# Patient Record
Sex: Male | Born: 1995 | Race: Black or African American | Hispanic: No | Marital: Single | Smoking: Current every day smoker
Health system: Southern US, Community
[De-identification: ages and names within clinical notes are randomized; demographics above are authoritative.]

## PROBLEM LIST (undated history)

## (undated) ENCOUNTER — Encounter

## (undated) ENCOUNTER — Telehealth

## (undated) DIAGNOSIS — I469 Cardiac arrest, cause unspecified: Secondary | ICD-10-CM

---
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
## 2016-04-16 ENCOUNTER — Ambulatory Visit: Attending: Family

## 2016-04-16 DIAGNOSIS — Z025 Encounter for examination for participation in sport: Secondary | ICD-10-CM

## 2016-04-16 DIAGNOSIS — Z6823 Body mass index (BMI) 23.0-23.9, adult: Principal | ICD-10-CM

## 2016-04-16 DIAGNOSIS — Z7189 Other specified counseling: Secondary | ICD-10-CM
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
## 2016-04-16 NOTE — Progress Notes
vitals from 04/16/2016.  Body mass index is 23.19 kg/(m^2). 58 %ile (Z= 0.21) based on CDC 2-20 Years BMI-for-age data using vitals from 04/16/2016.     Growth parameters are appropriate for age.         PHYSICAL EXAM:   NL   AB   Describe if Abnormal    General x      Head  x      Eyes x          Vision x  Passed both eyes at 20/40:yes    Ears x          Hearing   Passed all frequencies at 20 db: yes    Nose x      Pharynx x          Teeth x      Neck x      Nodes x      Lungs x      Cardiac x      Abdomen x      Genitals x  Not Circumcized    Extremities x          Pulses x      Spine x      Skin x      Neuro x     SPORTS PHYSICAL: normal  questionnaire form was completed on 04/16/16 by parent/guardian.  Copy is on file and sent to scanning.       Visual Acuity Screening    Right eye Left eye Both eyes   Without correction: 20/20 20/20 /20/20   With correction:          ASSESSMENT AND PLAN      Rodney Fuller presents today for their 20 y.o. Well Child Check.  General Assessment: WNL     ANTICIPATORY GUIDANCE REVIEWED: Yes No Comments   Physical Growth and Development: Balanced diet, 3 meals a day, limit fast food, physical activity, limit TV, MV or folate x     Social and Academic Competence: Age-appropriate limits, Friends/Relationships, Family time, Community Involvement, Encourage reading and/or school, Rules/ Expectations, Planning for after HS/College x     Emotional Well-being: Dealing with stress, body image, decision-making, mood changes, sexuality, cutting x     Risk Reduction: Safe media, tobacco, passive smoke, alcohol, steroids, drugs, prescription drugs, safe sex, STI's x     Illness prevention:  Dental care, dental home, floss, sun protection, protect hearing (avoid ear buds), sleep practices, snoring x   Sees Dentist     SAFETY/INJURY PREVENTION Yes No  Comments   Seat belts/Driving x     Conflict resolution x     Passive smoke x     Domestic violence screen/counseling x     Gun safety x

## 2016-04-16 NOTE — Progress Notes
Safe relationships x     Sexual safety x     Sports/recreation safety (helmets/gear) x        TESTS/SCREENING: Screened/  Tested Comments   Hgb/Hct Not tested    TB risk factors Not Tested    Hypercholesterolemia risk factors No risks    STI risk factors Not Tested Educated extensively on STI   Dental referral (required age 743 and up) Not needed    Immunizations Flu Vaccine at [employer]      No results found for this or any previous visit.      ICD-10-CM ICD-9-CM    1. BMI 23.0-23.9, adult Z68.23 V85.1    2. Advance directive discussed with patient Z71.89 V65.49    3. Routine sports examination for healthy child or adolescent Z02.5 V70.3        Follow-up visit in 1 year(s) for a Wellness Visit, or sooner as needed.

## 2016-04-16 NOTE — Patient Instructions
balance. Please be sure to let me know if you fall or are fearful you may fall.    Urinary Leakage:   Urine leakage is common, but it is not a normal part of aging. Talk with me about any urine leakage so that the cause can be found and treated. Treatment can include bladder training, exercises, medicine or surgery.    Pain:   We all have aches and pains at times, but chronic pain can change how you feel and live every day. Please talk with me about any symptoms of chronic pain so that we can determine how best to treat.     Sleep:    Getting a good night?s sleep is vital to your health and well-being and can help prevent or manage health problems.   Often, sleep can be improved by changing behaviors, including when you go to bed and what you do before bed.  Sleep apnea can cause problems such as struggling to stay awake during the day. Please let me know if you would like to learn more about improving your sleep and/or think you may have sleep apnea.     Home Safety:   You may benefit from a safety check to identify hazards in your home.     Seat Belt:   Please remember to wear a seat belt when driving or riding in a vehicle. It is one of the most important things you can do to stay safe in a car.    Nutrition:   Remember to eat plenty of fruits, vegetables, whole grains, and dairy. Drink at least 64 ounces (8 full glasses) of water a day, unless you have been advised to limit fluids.      Alcohol:   Alcohol can have a greater effect on older people, who may feel its effects at a lower amount.  Older people should limit alcoholic drinks (no more than one a day for women and no more than two a day for men). Please let me know if alcohol use becomes a problem.    Tobacco:   Not smoking or using other forms of tobacco is one of the most important things you can do for your health.     Advance Directives:   There may come a time when medical decisions need to be made on your

## 2016-04-16 NOTE — Patient Instructions
How Often:  Patient should have ONE of the following:  ? Stool test Yearly:    ? Sigmoidoscopy  Every 5 years  ? Colonoscopy  Every 10 years  To detect colon cancer before it has symptoms, so it can be more easily treated. This does not apply to you.     Depression Screening    Age:  18 and older    How Often: Yearly; As necessary for those with risk factors To identify patients at risk for depression Up to date     Diabetes screening tests    Age:  18 and older    How Often:  Atleast every 3years.      Medicare covers yearly or 6 month intervals for pre-diabetic patients.  Patient must be diagnosed with ONE of the following:  ? High blood pressure  ? History of abnormal cholesterol/triglyceride  ? Obesity (BMI >30 kg/m2)  ? History of high blood sugar  OR any TWO of the following:  ? Over weight (BMI >21 but <30)  ? Family history of Diabetes (parents, siblings)  ? Age 65+years  ? History of gestational diabetes or birth of baby weighing more than 9 pounds To identify diabetes that may not have symptoms. This does not apply to you.       Prostate cancer screening  (PSA or DRE)    Age:  50 (beginning the day after 50th birthday) and based on risk factors:  African American men or family history of prostate cancer    How Often:  Covered once every 12 months To detect prostate cancer.  You have not had a PSA level done in the past 3 years.  Discuss with your provider if this screening applies to you.       Consider test for Abdominal Aortic Aneurysm  (AAA)    Age:  65-75 if you have ever smoked or if your parent, brother, sister or child has had an aneurysm.    How Often:  Once Check for the bulging or ballooning of a large artery, which can lead to complications. This does not apply to you.     Consider Hepatitis C Virus Screening     Age:   If born between 1945-1965 OR         18 years and older based on risk factors    How Often:  Once OR based on risk factors   To detect hepatitis C

## 2016-04-16 NOTE — Patient Instructions
infection that may have no symptoms, to prevent complications. This does not apply to you.     Consider sexually transmitted disease testing    Age:  Consider if sexually active    How Often:   Gonorrhea/Chlamydia-Based on risk factors                           HIV-at least once, continue if risk factors  To detect infections that may not have symptoms, to prevent complications Discuss with your provider if at risk   Consider Alcohol misuse screening    Age:  Based on risk factors    How Often:  Based on risk factors To aid patient with alcohol related issues. Discuss with your provider if at risk   Consider Smoking Cessation Counseling    Age:  Patient must be a smoker    How Often:  Based on risk factors To aid patient with quitting smoking This does not apply to you.       Immunizations you have received:      There is no immunization history on file for this patient.    Recommended Immunizations Why   Influenza Vaccine  18+ years (Flu) Yearly  Shingles (Zoster) 60+years  Once  Tetanus  18+years  Every 10 years  Pneumococcal     Pneumonia: 1-2 doses age 65+    Prevnar: 1 dose age 65+    Consider Hepatitis B  Based on risk factors Influenza Vaccine  Protects against flu virus and its complications  Shingles (Zoster) Protects against shingles  Tetanus Protects against tetanus infection  Pneumococcal Protects against common types of pneumonia       Hepatitis B Protects against hepatitis, which can cause illness and complications

## 2016-04-16 NOTE — Patient Instructions
behalf. Please talk with your family, and with me, about your wishes. It is important to provide information about your decisions, and any formal advance directives, for your medical record.     Additional Support:  Sometimes it can be challenging to manage all aspects of daily life. Finding the right support can help you maintain or improve your health and independence. Please let me know if you would like to talk further about finding resources to assist you.    PREVENTATIVE SCREENING & IMMUNIZATION RECOMMENDATIONS  Below are your current Health maintenance plans and completion dates, if applicable.  Health Maintenance Reminders     Patient has no pending health maintenance at this time          The patient is not eligible for Health Maintenance     The following were addressed and declined:  The patient is not eligible for Health Maintenance     Preventative Screening Recommendations:  Below are general recommendations.  You may need them more frequently, less frequently, or not at all as we discussed during your visit, based on your individual needs, risk factors and preferences.    Preventive Service Why Status   Blood Pressure      Age: 7018 and older     How Often: Every visit, atleast every year To help prevent problems related to high blood pressure (heart attack and kidney problems) or low blood pressure (falls and dizziness). Most Recent BP Reading   04/16/16 128/68      Cardiovascular screening blood tests     (Cholesterol, Lipids, Triglycerides)    Age: Initial test for all at age 19; repeat at 2145 and continue until 4575   How Often:  Every 5 years (if normal) To check for high cholesterol, which can increase your risk of heart attack, stroke and other problems. Cholesterol:  Overdue    HDL:  Overdue    LDL:  Overdue    Triglycerides:  Overdue     Colon Cancer Screening    Age: 3250-75; if African-American begin screening at 5745, with colonoscopy recommended

## 2016-04-16 NOTE — Patient Instructions
PERSONAL PREVENTION PLAN   Thank you for taking time to come in and have your Annual Wellness Visit ? it is important that we have the opportunity to spend additional time with you once a year to discuss not only your preventative care needs, but to review how your care is being managed overall. We hope that you found today?s visit meaningful.     Your Personal Prevention Plan is based on your overall health and your responses to the health questionnaires completed. The following information is for you to review in addition to the recommendations, referrals, and tests we have discussed at your visit.        Things that may be affecting your health:  Tobacco Use      Physical Activity:   Physical activity can help you maintain a healthy weight, prevent or control illness, reduce stress, and sleep better. It can also help you improve your balance to avoid falls. Try to build up to and maintain a total of 30 minutes of activity each day. If you are able, try walking, doing yard or housework, and taking the stairs more often. You can also strengthen your muscles with exercises done while sitting or lying down.     Emotional Health:   Feeling ?down in the dumps? or anxious every now and then is a natural part of life. If this feeling lasts for a few weeks or more, talk with me as soon as possible. It could be a sign of a problem that needs treatment. There are many types of treatment available.      Falls:   You can reduce your risk of falling by making changes in your home. Remove items that may cause tripping, improve lighting, and consider installing grab bars.  Talk with me if you have problems with balance and walking. To prevent falls, you may need your vision, hearing, or blood pressure checked. Exercises to improve your strength and balance, or using a cane or walker, may help.  Review your medicines with me at every visit, because some can affect

## 2016-10-25 ENCOUNTER — Inpatient Hospital Stay: Admit: 2016-10-25 | Discharge: 2016-10-25

## 2016-10-25 ENCOUNTER — Emergency Department: Admit: 2016-10-25 | Discharge: 2016-10-25

## 2016-10-25 DIAGNOSIS — Z79899 Other long term (current) drug therapy: Secondary | ICD-10-CM

## 2016-10-25 DIAGNOSIS — S81011A Laceration without foreign body, right knee, initial encounter: Secondary | ICD-10-CM

## 2016-10-25 DIAGNOSIS — Z880 Allergy status to penicillin: Secondary | ICD-10-CM

## 2016-10-25 DIAGNOSIS — S022XXA Fracture of nasal bones, initial encounter for closed fracture: Secondary | ICD-10-CM

## 2016-10-25 DIAGNOSIS — S0181XA Laceration without foreign body of other part of head, initial encounter: Secondary | ICD-10-CM

## 2016-10-25 DIAGNOSIS — Z833 Family history of diabetes mellitus: Secondary | ICD-10-CM

## 2016-10-25 DIAGNOSIS — S62326A Displaced fracture of shaft of fifth metacarpal bone, right hand, initial encounter for closed fracture: Secondary | ICD-10-CM

## 2016-10-25 DIAGNOSIS — M79641 Pain in right hand: Principal | ICD-10-CM

## 2016-10-25 DIAGNOSIS — M84441A Pathological fracture, right hand, initial encounter for fracture: Principal | ICD-10-CM

## 2016-10-25 MED ORDER — HYDROCODONE-ACETAMINOPHEN 5-325 MG PO TABS
1 | ORAL_TABLET | Freq: Once | ORAL | Status: CP
Start: 2016-10-25 — End: ?

## 2016-10-25 MED ORDER — IBUPROFEN 800 MG PO TABS
800 mg | Freq: Four times a day (QID) | ORAL | 0 refills | Status: CP | PRN
Start: 2016-10-25 — End: ?

## 2016-10-25 MED ORDER — IODIXANOL 320 MG/ML IV SOLN
100 mL | Freq: Once | INTRAVENOUS | Status: CP
Start: 2016-10-25 — End: ?

## 2016-10-25 MED ORDER — HYDROCODONE-ACETAMINOPHEN 5-325 MG PO TABS
1 | ORAL_TABLET | Freq: Four times a day (QID) | ORAL | 0 refills | Status: CP | PRN
Start: 2016-10-25 — End: ?

## 2016-10-25 MED ORDER — TETANUS-DIPHTH-ACELL PERTUSSIS 5-2-15.5 LF-MCG/0.5 IM SUSP
.5 mL | Freq: Once | INTRAMUSCULAR | Status: CP
Start: 2016-10-25 — End: ?

## 2016-10-25 MED ORDER — LIDOCAINE HCL 2 % IJ SOLN
Freq: Once | Status: CP
Start: 2016-10-25 — End: ?

## 2016-10-25 NOTE — ED Notes
Ortho resident at bedside to assess R hand.

## 2016-10-25 NOTE — Consults
BP Temp Temp src Pulse Resp SpO2 Height Weight   10/25/16 0800 129/77 - - 76 19 100 % - -   10/25/16 0730 127/71 - - 71 23 99 % - -   10/25/16 0700 132/71 - - 78 13 100 % - -   10/25/16 0650 146/79 - - 79 21 99 % - -   10/25/16 0640 134/71 - - 72 25 100 % - -   10/25/16 0554 138/76 36.7 ?C (98 ?F) Oral 81 26 100 % 1.829 m (6') 82 kg (180 lb 12.4 oz)   10/25/16 0540 139/74 - - 77 22 100 % - -                Physical Exam:    Constitutional: well developed, well nourished, in no apparent distress  Cardiovascular: normal rate and regular rhythm    Pulmonary/Chest: clear bilaterally  Abdominal:  abdomen is soft without significant tenderness  Musculoskeletal: full range of motion without tenderness or swelling in joints  Neurological:  mental status, speech normal, alert and oriented x 3, PERLA  Neck:   supple, no adenopathy  HEENT:       General: 2cm lac deep to muscle at left forehead  Eyes: PERRL and EOMI  Nose: Normal  Ear: Hearing normal   Mouth: MIO 40mm  Dentition: grossly intact  Intraoral soft tissue: FOM soft, non-tender to palpation, non raised bilaterally, no ulceration, no laceration, no edema, no erythema  Intraoral hard tissue: no step deformity, no mobility of maxillary or mandibular segments  Occlusion: stable and repeatable  Trigeminal nerve findings: No abnormal findings  Facial Nerve: No abnormal findings    Data Review:  Recent Results (from the past 24 hour(s))   Type and Screen    Collection Time: 10/25/16  5:20 AM   Result Value Ref Range    ABO Grouping O     Rh Type Positive     Antibody Screen Negative     Specimen Expiration 2016-10-28 23:59    BMP    Collection Time: 10/25/16  5:49 AM   Result Value Ref Range    Sodium 143 135 - 145 mmol/L    Potassium 3.6 3.3 - 4.6 mmol/L    Chloride 105 101 - 110 mmol/L    CO2 25 21 - 29 mmol/L    Urea Nitrogen 12 6 - 22 mg/dL    Creatinine 1.610.96 0.960.67 - 1.17 mg/dL    BUN/Creatinine Ratio 12.5 6.0 - 22.0 (calc)    Glucose 91 71 - 99 mg/dL

## 2016-10-25 NOTE — ED Notes
Pt log rolled off backboard maintaining C-spine immobilization.  No tenderness noted to C-, T-, or L-spine area.  Contusion noted to left flank area.  No step offs noted.  Pt log rolled back to supine position.  Warm blankets applied.

## 2016-10-25 NOTE — ED Notes
Aram CandelaJohn R., PA at bedside to repair R knee laceration.
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
## 2016-10-25 NOTE — Consults
Calcium 9.2 8.6 - 10.0 mg/dL    Osmolality Calc 284.3     Anion Gap 13 4 - 16 mmol/L    EGFR >59 mL/min/1.73M2   PT    Collection Time: 10/25/16  5:49 AM   Result Value Ref Range    Protime 13.5 11.9 - 14.3 seconds    INR 1.0 0.9 - 1.1   PTT    Collection Time: 10/25/16  5:49 AM   Result Value Ref Range    PTT 33 24 - 35 seconds   Osmolality Blood    Collection Time: 10/25/16  5:49 AM   Result Value Ref Range    Osmolality 290 274 - 298 mosm/Kg   Ethyl Alcohol    Collection Time: 10/25/16  5:49 AM   Result Value Ref Range    Ethyl Alcohol <10.0 <=10.0 mg/dL    Alcohol g/dL <0.01 g//dL   CBC with Differential panel result    Collection Time: 10/25/16  5:49 AM   Result Value Ref Range    WBC 8.18 4.5 - 11 x10E3/uL    RBC 4.25 (L) 4.50 - 6.30 x10E6/uL    Hemoglobin 13.4 (L) 14.0 - 18.0 g/dL    Hematocrit 39.8 (L) 40.0 - 54.0 %    MCV 93.6 82.0 - 101.0 fl    MCH 31.5 27.0 - 34.0 pg    MCHC 33.7 31.0 - 36.0 g/dL    RDW 12.1 12.0 - 16.1 %    Platelet Count 261 140 - 440 thou/cu mm    MPV 10.1 9.5 - 11.5 fl    nRBC % 0.0 0.0 - 1.0 %    Absolute NRBC Count 0.00     Neutrophils % 69.3 34.0 - 73.0 %    Lymphocytes % 20.8 (L) 25.0 - 45.0 %    Monocytes % 7.6 (H) 2.0 - 6.0 %    Eosinophils % 2.0 1.0 - 4.0 %    Immature Granulocytes % 0.2 0.0 - 2.0 %    Neutrophils Absolute 5.67 1.80 - 8.70 x10E3/uL    Lymphocytes Absolute 1.70 x10E3/uL    Monocytes Absolute 0.62 x10E3/uL    Eosinophils Absolute 0.16 x10E3/uL    Basophil Absolute 0.01 x10E3/uL    Absolute Immature Granulocytes 0.02 (H) 0 - 0 x10E3/uL    Basophils % 0.1 0 - 1 %     Per Radiology:  Ct Maxface Dental W/o Iv Con    Result Date: 10/25/2016  Impression: No acute intracranial abnormality. Right frontotemporal scalp hematoma multiple radiodense foreign bodies. CT MAXFACE DENTAL W/O IV CON HISTORY: 21 years Male. INDICATION: Head trauma. TECHNIQUE: Maxillofacial CT was performed without intravenous contrast. Images were obtained of

## 2016-10-25 NOTE — ED Notes
Pt ambulated, voided, and tolerated po w/o difficulty.
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
## 2016-10-25 NOTE — Progress Notes
Pt wanted to contact grandmother but was unable to remember her contact number. Advised pt chaplain will check back later.   Pt provided contact information for grandmother, Rodney Fuller [phone]) No answer. No VM

## 2016-10-25 NOTE — Procedures
Oral Maxillofacial Surgery Resident  Procedure Note      Attending: Dr. Michele McalpineAnthony Bunnell    Lacerations: 2cm  laceration deep to muscle at left forehead    Anesthetic: 1.5% Lidocaine with 1:200,000 epinephrine, via infiltration and blocks.    Procedure: Irrigated site with copious amounts of normal saline. Closed lacerations in layers with vicryl and prolene sutures. Pt tolerated the procedure well. Pt was hemostatic at procedure end.     OMFS Recommendations:   - Keep wound clean with normal saline soaked gauze. Remove any scabs  - Dab dry  - Place thin layer of triple antibiotic on wound 2 times daily  - If at home, leave open to air  - If out, cover with simple dressing  - Keep out of sun for 6 months, or wear SPF  - Shower but avoid that area for 48 hours  - Do not soak in bath/pool/ocean for 2 weeks  - F/u in OMFS clinic in 7-10 days  - Please have patient call [phone] to schedule an appointment.    Thank you for allowing us to participate in the care of this patient.   Please do not hesitate to page the OMFS on call resident at [phone], if there are any questions or concerns    Rodney ChurchMatthew Fuller, DDS  10/25/2016 9:10 AM

## 2016-10-25 NOTE — Consults
secretions. Study: Cervical spine CT without contrast Indication: Head trauma. Comparison: None. Findings: Straightening of the cervical spine without fracture or listhesis. Craniocervical and atlantoaxial joints are maintained. No significant degenerative change lung apices are intact. Right paratracheal diverticulum noted. Impression: Straightening of the spine without fracture or listhesis. Incidental paratracheal diverticulum noted.     Per Radiology:  Xr Chest Single View    Result Date: 10/25/2016  No acute pulmonary process.     Per Radiology:  Xr Hand Right 3 Views    Result Date: 10/25/2016  Findings/impression: There is angulated fracture involving fifth metacarpal bone. There is adjacent soft tissue swelling. No dislocations are seen. No radiopaque foreign bodies are seen.    Per Radiology:  Xr Knee Right 4  Views    Result Date: 10/25/2016  Findings/impression: No evidence of fracture or dislocation. Joint spaces are maintained. Soft tissues are normal.    Per Radiology:  Xr Pelvis 1 Or 2 Views    Result Date: 10/25/2016  Findings/impression: No acute fracture or dislocations are identified. No soft tissue abnormalities or radiopaque foreign bodies are seen.     Per Radiology:  Xr Tibia & Fibula Right    Result Date: 10/25/2016  Findings/impression: No evidence of fracture or dislocation. Joint spaces are maintained. Soft tissues are normal.    Per Radiology:  Cta Trauma Thoracic Aorta W/o & W/ Iv Co    Result Date: 10/25/2016  Impression: Limited examination as discussed above. Normal examination with no traumatic abnormality of the thoracic aorta. Read By Burr Medico- Gregory Wynn M.D.  Electronically Verified By - Burr MedicoGregory Wynn M.D.  Released Date Time - 10/25/2016 8:28 AM  Resident -        Assessment & Recommendations:      21 y.o. male was evaluated for 2cm lac deep to muscle at left forehead. Lacs were repaired bedside under local anesthesia. Although scna

## 2016-10-25 NOTE — ED Notes
IV removed with catheter intact.  Pressure applied, Time of discharge: 1315  PM., Patient discharged to  Home.  Patient discharged  ambulatory. to exit with belongings in  Stable condition.  Patient escorted by  friend., Written discharge instructions given to  patient.  Patient/recipient  verbalizes discharge instructions.

## 2016-10-25 NOTE — Progress Notes
Pt wanted to contact grandmother but was unable to remember her contact number. Advised pt chaplain will check back later.

## 2016-10-25 NOTE — ED Notes
OMFS resident at bedside to repair laceration.

## 2016-10-25 NOTE — ED Notes
Pt to CT with Jack, EDT.

## 2016-10-25 NOTE — ED Notes
Report given to Holly, RN.  Pt stable at report.

## 2016-10-25 NOTE — ED Notes
Pt medicated for pain.
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
## 2016-10-25 NOTE — Consults
Soft tissue swelling is over the nasal bridge. Nondisplaced  right nasal bone fracture. Nasal septum is midline. Globes are intact. Retrobulbar fat is maintained. Maxillary mucosal thickening. Mandible is intact. TMJs are located. Nasopharyngeal secretions noted. Mastoid air cells are clear. IMPRESSION: Left supraorbital scalp laceration. Nondisplaced right nasal bone fracture with precanthal soft tissue swelling. Nasopharyngeal secretions. Study: Cervical spine CT without contrast Indication: Head trauma. Comparison: None. Findings: Straightening of the cervical spine without fracture or listhesis. Craniocervical and atlantoaxial joints are maintained. No significant degenerative change lung apices are intact. Right paratracheal diverticulum noted. Impression: Straightening of the spine without fracture or listhesis. Incidental paratracheal diverticulum noted.     Per Radiology:  Ct Head W/o Con    Result Date: 10/25/2016  Impression: No acute intracranial abnormality. Right frontotemporal scalp hematoma multiple radiodense foreign bodies. CT MAXFACE DENTAL W/O IV CON HISTORY: [age] years Male. INDICATION: Head trauma. TECHNIQUE: Maxillofacial CT was performed without intravenous contrast. Images were obtained of  the maxillofacial skeleton and paranasal sinuses and extended to include the orbits, skull base, and portions of the temporal bones. Multiplanar reconstructions were generated and reviewed. COMPARISON: No studies available for comparison. FINDINGS: Left supraorbital scalp laceration. Soft tissue swelling is over the nasal bridge. Nondisplaced  right nasal bone fracture. Nasal septum is midline. Globes are intact. Retrobulbar fat is maintained. Maxillary mucosal thickening. Mandible is intact. TMJs are located. Nasopharyngeal secretions noted. Mastoid air cells are clear. IMPRESSION: Left supraorbital scalp laceration. Nondisplaced right nasal bone fracture with precanthal soft tissue swelling. Nasopharyngeal

## 2016-10-25 NOTE — Consults
the maxillofacial skeleton and paranasal sinuses and extended to include the orbits, skull base, and portions of the temporal bones. Multiplanar reconstructions were generated and reviewed. COMPARISON: No studies available for comparison. FINDINGS: Left supraorbital scalp laceration. Soft tissue swelling is over the nasal bridge. Nondisplaced  right nasal bone fracture. Nasal septum is midline. Globes are intact. Retrobulbar fat is maintained. Maxillary mucosal thickening. Mandible is intact. TMJs are located. Nasopharyngeal secretions noted. Mastoid air cells are clear. IMPRESSION: Left supraorbital scalp laceration. Nondisplaced right nasal bone fracture with precanthal soft tissue swelling. Nasopharyngeal secretions. Study: Cervical spine CT without contrast Indication: Head trauma. Comparison: None. Findings: Straightening of the cervical spine without fracture or listhesis. Craniocervical and atlantoaxial joints are maintained. No significant degenerative change lung apices are intact. Right paratracheal diverticulum noted. Impression: Straightening of the spine without fracture or listhesis. Incidental paratracheal diverticulum noted.     Per Radiology:  Ct Abdomen Pelvis W Con    Result Date: 10/25/2016  Impression: No acute intra-abdominal pathology.     Per Radiology:  Ct C Spine W/o Con    Result Date: 10/25/2016  Impression: No acute intracranial abnormality. Right frontotemporal scalp hematoma multiple radiodense foreign bodies. CT MAXFACE DENTAL W/O IV CON HISTORY: [age] years Male. INDICATION: Head trauma. TECHNIQUE: Maxillofacial CT was performed without intravenous contrast. Images were obtained of  the maxillofacial skeleton and paranasal sinuses and extended to include the orbits, skull base, and portions of the temporal bones. Multiplanar reconstructions were generated and reviewed. COMPARISON: No studies available for comparison. FINDINGS: Left supraorbital scalp laceration.

## 2016-10-25 NOTE — ED Notes
FAST negative per Hester, MD.

## 2016-10-25 NOTE — ED Notes
Aram CandelaJohn R., PA at bedside to repair R knee laceration. Cervical collar cleared.

## 2016-10-25 NOTE — ED Notes
Pt log rolled off backboard maintaining C-spine immobilization.  No tenderness noted to C-, T-, or L-spine area.  No step offs noted.  Pt log rolled back to supine position.  Warm blankets applied.

## 2016-10-25 NOTE — Consults
BP Temp Temp src Pulse Resp SpO2 Height Weight   10/25/16 0800 129/77 - - 76 19 100 % - -   10/25/16 0730 127/71 - - 71 23 99 % - -   10/25/16 0700 132/71 - - 78 13 100 % - -   10/25/16 0650 146/79 - - 79 21 99 % - -   10/25/16 0640 134/71 - - 72 25 100 % - -   10/25/16 0554 138/76 36.7 ?C (98 ?F) Oral 81 26 100 % 1.829 m (6') 82 kg (180 lb 12.4 oz)   10/25/16 0540 139/74 - - 77 22 100 % - -                Physical Exam:    Constitutional: well developed, well nourished, in no apparent distress  Cardiovascular: normal rate and regular rhythm    Pulmonary/Chest: clear bilaterally  Abdominal:  abdomen is soft without significant tenderness  Musculoskeletal: full range of motion without tenderness or swelling in joints  Neurological:  mental status, speech normal, alert and oriented x 3, PERLA  Neck:   supple, no adenopathy  HEENT:       General: 2cm lac deep to muscle at left forehead, scabbing at R forehead  Eyes: PERRL and EOMI  Nose: Normal  Ear: Hearing normal   Mouth: MIO 40mm  Dentition: grossly intact  Intraoral soft tissue: FOM soft, non-tender to palpation, non raised bilaterally, no ulceration, no laceration, no edema, no erythema  Intraoral hard tissue: no step deformity, no mobility of maxillary or mandibular segments  Occlusion: stable and repeatable  Trigeminal nerve findings: No abnormal findings  Facial Nerve: No abnormal findings    Data Review:  Recent Results (from the past 24 hour(s))   Type and Screen    Collection Time: 10/25/16  5:20 AM   Result Value Ref Range    ABO Grouping O     Rh Type Positive     Antibody Screen Negative     Specimen Expiration 2016-10-28 23:59    BMP    Collection Time: 10/25/16  5:49 AM   Result Value Ref Range    Sodium 143 135 - 145 mmol/L    Potassium 3.6 3.3 - 4.6 mmol/L    Chloride 105 101 - 110 mmol/L    CO2 25 21 - 29 mmol/L    Urea Nitrogen 12 6 - 22 mg/dL    Creatinine 1.610.96 0.960.67 - 1.17 mg/dL    BUN/Creatinine Ratio 12.5 6.0 - 22.0 (calc)
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
## 2016-10-25 NOTE — Consults
identifies subcutaneous foreign bodies at right scalp, none found on physical exam.    OMFS Plan/Rec  - Nasal bone fracture   - Keep wound clean with normal saline soaked gauze. Remove any scabs  - Dab dry  - Place thin layer of triple antibiotic on wound 2 times daily  - If at home, leave open to air  - If out, cover with simple dressing  - Keep out of sun for 6 months, or wear SPF  - Shower but avoid that area for 48 hours  - Do not soak in bath/pool/ocean for 2 weeks  - F/u in OMFS clinic in 7-10 days  - Please have patient call [phone] to schedule an appointment.    Thank you for allowing us to participate in the care of this patient.   Please do not hesitate to page the OMFS on call resident at [phone], if there are any questions or concerns    Stefan ChurchMatthew Yekikian, DDS  10/25/2016 9:10 AM

## 2016-10-25 NOTE — Consults
Please see Trauma H/P

## 2016-10-25 NOTE — ED Notes
Xray at bedside with pt for chest and pelvic xray.

## 2016-10-25 NOTE — H&P
Lungs: no increased work of breathing  clear bilaterally   Cardiac normal   Abdomen Abd soft but ttp in LLQ with gaurding, nonperiotneal   Back   THORACIC SPINE There is no tenderness.    LUMBAR SPINE There is no tenderness.    Pelvis non-tender, stable to anterior-posterior/lateral compression   Rectum: Tone deferred                  Blood    Genitalia normal   Extremities R anterior tibia with swelling; has lac to R knee   Vascular normal   Neuro normal GCS:  4 - Opens eyes on own   5 - Alert and oriented   6 - Follows simple motor commands  Total: 15 Intubated?: no   Skin normal       Radiology:    I have ordered x ray studies and reviewed the results as follows:   All imaging pending     CXR  Pelvis XR  CT head/C spine/maxface/chest/abd/pelvis    Procedures: FAST:  neg    Assessment and Plan:   ASSESSMENT   There is no problem list on file for this patient.  .      Patient's condition is     Plan:   ABCD intact  FAST neg  F/u imaging       Disposition: Pending        Consulting service:   Reason for consult:   Consult requested by Emergency Room attending for: appropriateness of care    Marinell BlightUSHAR GUPTA, MD  10/25/2016, 5:40 AM

## 2016-10-25 NOTE — Consults
Glucose 91 71 - 99 mg/dL    Calcium 9.2 8.6 - 10.0 mg/dL    Osmolality Calc 284.3     Anion Gap 13 4 - 16 mmol/L    EGFR >59 mL/min/1.73M2   PT    Collection Time: 10/25/16  5:49 AM   Result Value Ref Range    Protime 13.5 11.9 - 14.3 seconds    INR 1.0 0.9 - 1.1   PTT    Collection Time: 10/25/16  5:49 AM   Result Value Ref Range    PTT 33 24 - 35 seconds   Osmolality Blood    Collection Time: 10/25/16  5:49 AM   Result Value Ref Range    Osmolality 290 274 - 298 mosm/Kg   Ethyl Alcohol    Collection Time: 10/25/16  5:49 AM   Result Value Ref Range    Ethyl Alcohol <10.0 <=10.0 mg/dL    Alcohol g/dL <0.01 g//dL   CBC with Differential panel result    Collection Time: 10/25/16  5:49 AM   Result Value Ref Range    WBC 8.18 4.5 - 11 x10E3/uL    RBC 4.25 (L) 4.50 - 6.30 x10E6/uL    Hemoglobin 13.4 (L) 14.0 - 18.0 g/dL    Hematocrit 39.8 (L) 40.0 - 54.0 %    MCV 93.6 82.0 - 101.0 fl    MCH 31.5 27.0 - 34.0 pg    MCHC 33.7 31.0 - 36.0 g/dL    RDW 12.1 12.0 - 16.1 %    Platelet Count 261 140 - 440 thou/cu mm    MPV 10.1 9.5 - 11.5 fl    nRBC % 0.0 0.0 - 1.0 %    Absolute NRBC Count 0.00     Neutrophils % 69.3 34.0 - 73.0 %    Lymphocytes % 20.8 (L) 25.0 - 45.0 %    Monocytes % 7.6 (H) 2.0 - 6.0 %    Eosinophils % 2.0 1.0 - 4.0 %    Immature Granulocytes % 0.2 0.0 - 2.0 %    Neutrophils Absolute 5.67 1.80 - 8.70 x10E3/uL    Lymphocytes Absolute 1.70 x10E3/uL    Monocytes Absolute 0.62 x10E3/uL    Eosinophils Absolute 0.16 x10E3/uL    Basophil Absolute 0.01 x10E3/uL    Absolute Immature Granulocytes 0.02 (H) 0 - 0 x10E3/uL    Basophils % 0.1 0 - 1 %     Per Radiology:  Ct Maxface Dental W/o Iv Con    Result Date: 10/25/2016  Impression: No acute intracranial abnormality. Right frontotemporal scalp hematoma multiple radiodense foreign bodies. CT MAXFACE DENTAL W/O IV CON HISTORY: 21 years Male. INDICATION: Head trauma. TECHNIQUE: Maxillofacial CT was performed without intravenous contrast. Images were obtained of

## 2016-10-25 NOTE — Progress Notes
No midline bony cervical pain on palpation or movement. Discontinue MJ collar.

## 2016-10-25 NOTE — ED Notes
Pt back from CT.  No change in pt assessment at this time.

## 2016-10-25 NOTE — ED Notes
Recd verbal orders to prepare patient for discharge.

## 2016-10-26 NOTE — Procedures
Procedure Date: 10/25/2016  Procedure: laceration repair  Pre-procedure Diagnosis: right knee laceration  Post-procedure Diagnosis: same as above    Prior to Procedure:   Attending Staff: Erling ConteFiras Madbak, MD       Skin Prep:  Cleansed with chloraprep.  Anesthesia: Lidocaine 1% without epinephrine 5 ml without added sodium bicarbonate        Indications: Rodney Fuller is a 21 y.o. male patient with a L shaped laceration measuring approximately 4 cm in length on the right knee. Examination of the wound for foreign bodies and devitalized tissue showed none.  Examination of the surrounding area for neural or vascular damage showed none. The identity of the patient was confirmed and a bedside time out was performed.    Description of Procedure: The wound was repaired with 3-0 Prolene; 5 were used.    Findings: right knee laceration  Complications: The patient tolerated the procedure well with no complications.  Specimens:none   Estimated blood loss: trace    Rodney Fuller  10/25/2016 9:47 AM       I was present and supervised the procedure.    Rodney DecantFiras G. Lafe GarinMadbak, MD, FACS  Acute Care Surgery

## 2016-10-26 NOTE — Procedures
Procedure Date: 10/25/2016  Procedure: laceration repair  Pre-procedure Diagnosis: right knee laceration  Post-procedure Diagnosis: same as above    Prior to Procedure:   Attending Staff: Erling ConteFiras Madbak, MD       Skin Prep:  Cleansed with chloraprep.  Anesthesia: Lidocaine 1% without epinephrine 5 ml without added sodium bicarbonate        Indications: Rodney Fuller is a 21 y.o. male patient with a L shaped laceration measuring approximately 4 cm in length on the right knee. Examination of the wound for foreign bodies and devitalized tissue showed none.  Examination of the surrounding area for neural or vascular damage showed none. The identity of the patient was confirmed and a bedside time out was performed.    Description of Procedure: The wound was repaired with 3-0 Prolene; 5 were used.    Findings: right knee laceration  Complications: The patient tolerated the procedure well with no complications.  Specimens:none   Estimated blood loss: trace    Rodney Fuller  10/25/2016 9:47 AM
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
## 2016-10-31 NOTE — ED Provider Notes
consultants, evaluation of patient's response to treatment, examination of patient, obtaining history from patient or surrogate, ordering and performing treatments and interventions, ordering and review of laboratory studies, ordering and review of radiographic studies, pulse oximetry and re-evaluation of patient's condition.  Subsequent provider of critical care: I assumed direction of critical care for this patient from another provider of my specialty.          Labs:  -   CBC AUTODIFF - Abnormal        Result Value Ref Range    WBC 8.18  4.5 - 11 x10E3/uL    RBC 4.25 (*) 4.50 - 6.30 x10E6/uL    Hemoglobin 13.4 (*) 14.0 - 18.0 g/dL    Hematocrit 16.139.8 (*) 40.0 - 54.0 %    MCV 93.6  82.0 - 101.0 fl    MCH 31.5  27.0 - 34.0 pg    MCHC 33.7  31.0 - 36.0 g/dL    RDW 09.612.1  04.512.0 - 40.916.1 %    Platelet Count 261  140 - 440 thou/cu mm    MPV 10.1  9.5 - 11.5 fl    nRBC % 0.0  0.0 - 1.0 %    Absolute NRBC Count 0.00      Neutrophils % 69.3  34.0 - 73.0 %    Lymphocytes % 20.8 (*) 25.0 - 45.0 %    Monocytes % 7.6 (*) 2.0 - 6.0 %    Eosinophils % 2.0  1.0 - 4.0 %    Immature Granulocytes % 0.2  0.0 - 2.0 %    Neutrophils Absolute 5.67  1.80 - 8.70 x10E3/uL    Lymphocytes Absolute 1.70  x10E3/uL    Monocytes Absolute 0.62  x10E3/uL    Eosinophils Absolute 0.16  x10E3/uL    Basophil Absolute 0.01  x10E3/uL    Absolute Immature Granulocytes 0.02 (*) 0 - 0 x10E3/uL    Basophils % 0.1  0 - 1 %   PROTIME-INR - Normal    Protime 13.5  11.9 - 14.3 seconds    INR 1.0  0.9 - 1.1   APTT - Normal    PTT 33  24 - 35 seconds   OSMOLALITY BLOOD - Normal    Osmolality 290  274 - 298 mosm/Kg   BASIC METABOLIC PANEL    Sodium 143  811135 - 145 mmol/L    Potassium 3.6  3.3 - 4.6 mmol/L    Chloride 105  101 - 110 mmol/L    CO2 25  21 - 29 mmol/L    Urea Nitrogen 12  6 - 22 mg/dL    Creatinine 9.140.96  7.820.67 - 1.17 mg/dL    BUN/Creatinine Ratio 12.5  6.0 - 22.0 (calc)    Glucose 91  71 - 99 mg/dL    Calcium 9.2  8.6 - 95.610.0 mg/dL
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
## 2016-10-31 NOTE — ED Provider Notes
BMI (Calculated) 10/25/16 0554 24.57             Physical Exam    Differential DDx: ***    Is this an Emergent Medical Condition? {SH ED EMERGENT MEDICAL [phone]}  409.901 FS  641.19 FS  627.732 (16) FS    ED Workup   Procedures    Labs:  -   CBC AUTODIFF - Abnormal        Result Value Ref Range    WBC 8.18  4.5 - 11 x10E3/uL    RBC 4.25 (*) 4.50 - 6.30 x10E6/uL    Hemoglobin 13.4 (*) 14.0 - 18.0 g/dL    Hematocrit 39.8 (*) 40.0 - 54.0 %    MCV 93.6  82.0 - 101.0 fl    MCH 31.5  27.0 - 34.0 pg    MCHC 33.7  31.0 - 36.0 g/dL    RDW 12.1  12.0 - 16.1 %    Platelet Count 261  140 - 440 thou/cu mm    MPV 10.1  9.5 - 11.5 fl    nRBC % 0.0  0.0 - 1.0 %    Absolute NRBC Count 0.00      Neutrophils % 69.3  34.0 - 73.0 %    Lymphocytes % 20.8 (*) 25.0 - 45.0 %    Monocytes % 7.6 (*) 2.0 - 6.0 %    Eosinophils % 2.0  1.0 - 4.0 %    Immature Granulocytes % 0.2  0.0 - 2.0 %    Neutrophils Absolute 5.67  1.80 - 8.70 x10E3/uL    Lymphocytes Absolute 1.70  x10E3/uL    Monocytes Absolute 0.62  x10E3/uL    Eosinophils Absolute 0.16  x10E3/uL    Basophil Absolute 0.01  x10E3/uL    Absolute Immature Granulocytes 0.02 (*) 0 - 0 x10E3/uL    Basophils % 0.1  0 - 1 %   PROTIME-INR - Normal    Protime 13.5  11.9 - 14.3 seconds    INR 1.0  0.9 - 1.1   APTT - Normal    PTT 33  24 - 35 seconds   BASIC METABOLIC PANEL    Sodium 614  135 - 145 mmol/L    Potassium 3.6  3.3 - 4.6 mmol/L    Chloride 105  101 - 110 mmol/L    CO2 25  21 - 29 mmol/L    Urea Nitrogen 12  6 - 22 mg/dL    Creatinine 0.96  0.67 - 1.17 mg/dL    BUN/Creatinine Ratio 12.5  6.0 - 22.0 (calc)    Glucose 91  71 - 99 mg/dL    Calcium 9.2  8.6 - 10.0 mg/dL    Osmolality Calc 284.3      Anion Gap 13  4 - 16 mmol/L    EGFR >59  mL/min/1.73M2    Comment:   Reference range: =>90 ml/min/1.73M2  eGFR estimates are unable to accurately differentiate levels of GFR above 60 ml/min/1.73M2.   ETHYL ALCOHOL    Ethyl Alcohol <10.0  <=10.0 mg/dL    Alcohol g/dL <0.01  g//dL

## 2016-10-31 NOTE — ED Provider Notes
Lymphocytes Absolute 1.70  x10E3/uL    Monocytes Absolute 0.62  x10E3/uL    Eosinophils Absolute 0.16  x10E3/uL    Basophil Absolute 0.01  x10E3/uL    Absolute Immature Granulocytes 0.02 (*) 0 - 0 x10E3/uL    Basophils % 0.1  0 - 1 %   PROTIME-INR - Normal    Protime 13.5  11.9 - 14.3 seconds    INR 1.0  0.9 - 1.1   APTT - Normal    PTT 33  24 - 35 seconds   BASIC METABOLIC PANEL    Sodium 315  135 - 145 mmol/L    Potassium 3.6  3.3 - 4.6 mmol/L    Chloride 105  101 - 110 mmol/L    CO2 25  21 - 29 mmol/L    Urea Nitrogen 12  6 - 22 mg/dL    Creatinine 0.96  0.67 - 1.17 mg/dL    BUN/Creatinine Ratio 12.5  6.0 - 22.0 (calc)    Glucose 91  71 - 99 mg/dL    Calcium 9.2  8.6 - 10.0 mg/dL    Osmolality Calc 284.3      Anion Gap 13  4 - 16 mmol/L    EGFR >59  mL/min/1.73M2    Comment:   Reference range: =>90 ml/min/1.73M2  eGFR estimates are unable to accurately differentiate levels of GFR above 60 ml/min/1.73M2.   ETHYL ALCOHOL    Ethyl Alcohol <10.0  <=10.0 mg/dL    Alcohol g/dL <0.01  g//dL   OSMOLALITY BLOOD   TYPE & SCREEN    ABO Grouping O      Rh Type Positive      Antibody Screen Negative      Specimen Expiration 2016-10-28 23:59     CBC AND DIFFERENTIAL         Imaging (Read by ED Provider):  {Imaging [phone]}      EKG (Read by ED Provider):  {EKG findings:[phone]}        ED Course & Re-Evaluation     ED Course   Comment By Time   Signed out pt to Dr. Sabra Heck. Please f/u imaging and trauma recs. Heriberto Antigua, MD 05/06 0630         MDM   Decide to obtain history from someone other than the patient: {SH ED [place] VVOHYWV:37106}    Decide to obtain previous medical records: [facility] ED [place] MDM - PREVIOUS MED REC - NO YIR:48546}    Clinical Lab Test(s): {SH ED [place] MDM ORDERED AND REVIEWED:28124}    Diagnostic Tests (Radiology, EKG): {SH ED [place] MDM ORDERED AND REVIEWED:28124}    Independent Visualization (ED [place], Wet Prep, Other): [facility] MDM NO YES [id number]}

## 2016-10-31 NOTE — ED Provider Notes
Timing:  Constant    Progression:  Unchanged    Chronicity:  New      Allergies   Allergen Reactions   ? Pcn [Penicillins] Other (See Comments)     Pt states that he does not know the effects, he was told by family not to take it.       Patient's Medications   New Prescriptions    No medications on file   Previous Medications    ACETAMINOPHEN-CODEINE 120-12 MG/5ML SOLUTION    Take 10 mLs by mouth 3 times daily as needed for pain.    IBUPROFEN (ADVIL,MOTRIN) 800 MG TABLET    take 1 tablet by mouth three times a day if needed for pain take with food or milk   Modified Medications    No medications on file   Discontinued Medications    No medications on file       History reviewed. No pertinent past medical history.    History reviewed. No pertinent surgical history.    Family History   Problem Relation Age of Onset   ? Diabetes Mother    ? No Known Problems Father        Social History     Social History   ? Marital status: Single     Spouse name: N/A   ? Number of children: N/A   ? Years of education: N/A     Social History Main Topics   ? Smoking status: Never Smoker   ? Smokeless tobacco: Never Used   ? Alcohol use No   ? Drug use: 3.00 per week     Special: Marijuana   ? Sexual activity: Not Asked     Other Topics Concern   ? None     Social History Narrative       Review of Systems   HENT: Negative for nosebleeds, facial swelling, neck pain, neck stiffness and dental problem.    Eyes: Negative for pain and visual disturbance.   Respiratory: Negative for chest tightness and shortness of breath.    Cardiovascular: Negative for chest pain.   Gastrointestinal: Positive for abdominal pain. Negative for nausea and vomiting.   Genitourinary: Negative for enuresis and pelvic pain.   Musculoskeletal: Positive for extremity pain. Negative for back pain and joint swelling.   Skin: Negative for wound.   Neurological: Positive for headaches. Negative for dizziness, seizures,

## 2016-10-31 NOTE — ED Provider Notes
Timing:  Constant    Progression:  Unchanged    Chronicity:  New      Allergies   Allergen Reactions   ? Pcn [Penicillins] Other (See Comments)     Pt states that he does not know the effects, he was told by family not to take it.       Discharge Medication List as of 10/25/2016 12:58 PM      START taking these medications    Details   HYDROcodone-acetaminophen (NORCO) 5-325 MG PO Tablet Take 1 tablet by mouth every 6 hours as needed for pain.Disp-12 tablet, R-0, Normal         CONTINUE these medications which have CHANGED    Details   ibuprofen (ADVIL,MOTRIN) 800 MG PO Tablet Take 1 tablet by mouth every 6 hours as needed for pain.Disp-30 tablet, R-0, Normal         CONTINUE these medications which have NOT CHANGED    Details   acetaminophen-codeine 120-12 MG/5ML Solution Take 10 mLs by mouth 3 times daily as needed for pain.Disp-120 mL, R-0, Print             History reviewed. No pertinent past medical history.    History reviewed. No pertinent surgical history.    Family History   Problem Relation Age of Onset   ? Diabetes Mother    ? No Known Problems Father        Social History     Social History   ? Marital status: Single     Spouse name: N/A   ? Number of children: N/A   ? Years of education: N/A     Social History Main Topics   ? Smoking status: Never Smoker   ? Smokeless tobacco: Never Used   ? Alcohol use No   ? Drug use: Yes     Frequency: 3.0 times per week     Types: Marijuana   ? Sexual activity: Not Asked     Other Topics Concern   ? None     Social History Narrative   ? None       Review of Systems   HENT: Negative for nosebleeds, facial swelling, neck pain, neck stiffness and dental problem.    Eyes: Negative for pain and visual disturbance.   Respiratory: Negative for chest tightness and shortness of breath.    Cardiovascular: Negative for chest pain.   Gastrointestinal: Positive for abdominal pain. Negative for nausea and vomiting.   Genitourinary: Negative for enuresis and pelvic pain.

## 2016-10-31 NOTE — ED Provider Notes
secretions. Study: Cervical spine CT without contrast Indication: Head trauma. Comparison: None. Findings: Straightening of the cervical spine without fracture or listhesis. Craniocervical and atlantoaxial joints are maintained. No significant degenerative change lung apices are intact. Right paratracheal diverticulum noted. Impression: Straightening of the spine without fracture or listhesis. Incidental paratracheal diverticulum noted. I personally reviewed the images and the residents findings and agree with the above. Read By Alric Ran- Inbal Cohen M.D.  Electronically Verified By Alric Ran- Inbal Cohen M.D.  Released Date Time - 10/25/2016 9:58 AM  Resident - Lia HoppingJeremiah Libby    Per Radiology: Ct Abdomen Pelvis W Con    Result Date: 10/25/2016  Procedure:  CT ABDOMEN PELVIS W CON Ordering History:  Abdominal pain, other specified site Comparison: None Technique: Axial multislice images of the abdomen and pelvis from the lung bases to the proximal femurs were obtained following the administration of intravenous contrast. Multiplanar  reconstructions were performed. Findings: The examination is limited by beam hardening artifact related to the patient's upper extremities which were not elevated out of the scan field. Lung bases: Visualized lung bases are clear. Liver: Normal. Spleen: Normal. Pancreas: Normal. Kidneys: Both kidneys enhance homogeneously and are normal in appearance. Urinary bladder:  Negative Adrenals: Both adrenal glands are normal. Bowel: The nonopacified loops of small bowel and colon are of normal caliber. The appendix is identified on and is within normal limits in appearance. Mesentery/retroperitoneum: There are no enlarged abdominal or pelvic lymph nodes. Pelvic viscera: The prostate gland and seminal vesicles are within normal limits. Osseous structures: No osseous abnormality.     Impression: Limited examination as discussed above. No intra-abdominal/pelvic pathology. I personally reviewed the images and the

## 2016-10-31 NOTE — ED Provider Notes
syncope, speech difficulty, weakness, light-headedness and numbness.   Psychiatric/Behavioral: Negative for substance abuse.       Physical Exam     ED Triage Vitals   BP 10/25/16 0554 138/76   Pulse 10/25/16 0554 81   Resp 10/25/16 0554 26   Temp 10/25/16 0554 36.7 ?C (98 ?F)   Temp src 10/25/16 0554 Oral   Height 10/25/16 0554 1.829 m   Weight 10/25/16 0554 82 kg   SpO2 10/25/16 0554 100 %   BMI (Calculated) 10/25/16 0554 24.57             Physical Exam   Constitutional: He is oriented to person, place, and time. He appears well-developed and well-nourished. No distress.   HENT:   Head: Normocephalic and atraumatic.   Right Ear: External ear normal.   Left Ear: External ear normal.   Nose: Nose normal.   Mouth/Throat: Oropharynx is clear and moist. No oropharyngeal exudate.   L forehead lac 2cm. Bleeding controlled. R forehead abrasions.   Eyes: Conjunctivae and EOM are normal. Pupils are equal, round, and reactive to light. Right eye exhibits no discharge. Left eye exhibits no discharge. No scleral icterus.   Neck: No JVD present. No tracheal deviation present.   Cardiovascular: Normal rate, regular rhythm and intact distal pulses.  Exam reveals no gallop and no friction rub.    No murmur heard.  2+ pulses in all extremities   Pulmonary/Chest: Effort normal and breath sounds normal. No stridor. No respiratory distress. He has no wheezes. He has no rales. He exhibits no tenderness.   Abdominal: Soft. He exhibits no distension and no mass. There is tenderness. There is no rebound and no guarding.   +TTP in LLQ   Musculoskeletal: Normal range of motion. He exhibits tenderness. He exhibits no edema or deformity.   Moving all extremities. TTP to R hand.   Neurological: He is alert and oriented to person, place, and time.   Moving all extremities with no gross deficit, GCS 15   Skin: Skin is warm and dry. He is not diaphoretic.   Psychiatric: He has a normal mood and affect. His behavior is normal.
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
## 2016-10-31 NOTE — ED Provider Notes
Discussed patient with NON-ED Provider: {SH ED Lamonte SakaiJX MDM - ANOTHER PROVIDER:28381}      ED Disposition   ED Disposition: No ED Disposition Set      ED Clinical Impression   ED Clinical Impression:   No Clinical Impression Set      ED Patient Status   Patient Status:   [facility]US:[phone]}        ED Medical Evaluation Initiated   Medical Evaluation Initiated:   Yes, filed at 10/25/16 0519  by Threasa HeadsHester, Zachary, MD
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
## 2016-10-31 NOTE — ED Provider Notes
Musculoskeletal: Positive for extremity pain. Negative for back pain and joint swelling.   Skin: Negative for wound.   Neurological: Positive for headaches. Negative for dizziness, seizures, syncope, speech difficulty, weakness, light-headedness and numbness.   Psychiatric/Behavioral: Negative for substance abuse.       Physical Exam     ED Triage Vitals   BP 10/25/16 0540 139/74   Pulse 10/25/16 0540 77   Resp 10/25/16 0540 22   Temp 10/25/16 0554 36.7 ?C (98 ?F)   Temp src 10/25/16 0554 Oral   Height 10/25/16 0554 1.829 m   Weight 10/25/16 0554 82 kg   SpO2 10/25/16 0540 100 %   BMI (Calculated) 10/25/16 0554 24.57             Physical Exam   Constitutional: He is oriented to person, place, and time. He appears well-developed and well-nourished. No distress.   HENT:   Head: Normocephalic and atraumatic.   Right Ear: External ear normal.   Left Ear: External ear normal.   Nose: Nose normal.   Mouth/Throat: Oropharynx is clear and moist. No oropharyngeal exudate.   L forehead lac 2cm. Bleeding controlled. R forehead abrasions.   Eyes: Conjunctivae and EOM are normal. Pupils are equal, round, and reactive to light. Right eye exhibits no discharge. Left eye exhibits no discharge. No scleral icterus.   Neck: No JVD present. No tracheal deviation present.   Cardiovascular: Normal rate, regular rhythm and intact distal pulses.  Exam reveals no gallop and no friction rub.    No murmur heard.  2+ pulses in all extremities   Pulmonary/Chest: Effort normal and breath sounds normal. No stridor. No respiratory distress. He has no wheezes. He has no rales. He exhibits no tenderness.   Abdominal: Soft. He exhibits no distension and no mass. There is tenderness. There is no rebound and no guarding.   +TTP in LLQ   Musculoskeletal: Normal range of motion. He exhibits tenderness. He exhibits no edema or deformity.   Moving all extremities. TTP to R hand.   Neurological: He is alert and oriented to person, place, and time.
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
## 2016-11-01 NOTE — Consults
Please see consult note 10/25/2016 1043
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
## 2016-11-02 NOTE — Consults
In addition, the consequences of no treatment were discussed in detail with the patient.  The patient was given a chance to ask any questions they may have had about the proposed treatment plan, and these questions were answered by the treating physician.   -pain control per Primary  -No weight bearing RUE extremity  -Keep splint clean, dry and in place  -Follow up with Dr. Purnell ShoemakerKaye of [facility] in 1 week. Please have patient call [phone] to schedule an appointment.    Rodney LundMohamed Fuller  10/25/2016   10:43 AM
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
## 2016-11-02 NOTE — Consults
Current Outpatient Prescriptions on File Prior to Encounter   Medication Sig Dispense Refill   ? acetaminophen-codeine 120-12 MG/5ML Solution Take 10 mLs by mouth 3 times daily as needed for pain. 120 mL 0   ? ibuprofen (ADVIL,MOTRIN) 800 MG Tablet take 1 tablet by mouth three times a day if needed for pain take with food or milk  0     Current Hospital Medications:  Scheduled:   ? lidocaine   Infiltration Once   ? tetanus-diphtheria-pertussis vaccine, Tdap  0.5 mL Intramuscular Once     Continuous Infusions:   PRN:    Allergies:  Pcn [penicillins]    Review of Systems:  Neg except per HPI      Objective:      Patient Vitals for the past 8 hrs:   BP Temp Temp src Pulse Resp SpO2 Height Weight   10/25/16 0900 133/74 - - 85 10 100 % - -   10/25/16 0800 129/77 - - 76 19 100 % - -   10/25/16 0730 127/71 - - 71 23 99 % - -   10/25/16 0700 132/71 - - 78 13 100 % - -   10/25/16 0650 146/79 - - 79 21 99 % - -   10/25/16 0640 134/71 - - 72 25 100 % - -   10/25/16 0554 138/76 36.7 ?C (98 ?F) Oral 81 26 100 % 1.829 m (6') 82 kg (180 lb 12.4 oz)   10/25/16 0540 139/74 - - 77 22 100 % - -         Physical Exam  General:  Alert, oriented x 3, no acute distress    Cardiac:  Palpable pulses, normal rate in a regular rhythm    Respiratory:  Symmetric chest rise, good air movement    Right Upper Extremity:  Palpable radial pulse.  Sensation to light touch present in median/ulnar/radial/axillary nerve distributions.   Motor functioning in median/ulnar/radial/axillary nerve distributions.    Skin intact. No edema or ecchymosis.   Tender to palpation of R fifth MC shaft, otherwise no tenderness to palpation of bones and joints.   No deformities or crepitus.      Left Upper Extremity:  Palpable radial pulse.  Sensation to light touch present in median/ulnar/radial/axillary nerve distributions.   Motor functioning in median/ulnar/radial/axillary nerve distributions.    Skin intact. No edema or ecchymosis.

## 2016-11-06 ENCOUNTER — Ambulatory Visit: Attending: Student in an Organized Health Care Education/Training Program

## 2016-11-06 DIAGNOSIS — S0181XD Laceration without foreign body of other part of head, subsequent encounter: Principal | ICD-10-CM

## 2016-11-06 DIAGNOSIS — S022XXD Fracture of nasal bones, subsequent encounter for fracture with routine healing: Secondary | ICD-10-CM
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
## 2016-11-06 NOTE — Progress Notes
HENT:   Head: Normocephalic and atraumatic.   Mouth/Throat: Oropharynx is clear and moist.   L forehead laceration well healed  Nose midline, no crepitus, no congestion   Eyes: EOM are normal. Pupils are equal, round, and reactive to light.   Neck: Neck supple.   Cardiovascular: Normal rate.    Pulmonary/Chest: Effort normal.   Lymphadenopathy:     He has no cervical adenopathy.   Neurological: He is oriented to person, place, and time. GCS score is 15.   Skin: He is not diaphoretic.       Assessment:    ICD-10-CM ICD-9-CM   1. Facial laceration, subsequent encounter S01.81XD V58.89     873.40   2. Closed fracture of nasal bone with routine healing, subsequent encounter S02.2XXD V54.19     54M presens with well healed L forehead laceration. Nasal bone fracture is healing well, nose is midline and there is no indication for surgery    Plan:  No orders of the following type(s) were placed in this encounter: Procedures    No orders of the following type(s) were placed in this encounter: Medications.       -Wound care reviewed, all questions answered  -F/U with OMFS as needed

## 2016-11-06 NOTE — Progress Notes
?   Not on file     Social History Narrative   ? No narrative on file       Family Hx:  Family History   Problem Relation Age of Onset   ? Diabetes Mother    ? No Known Problems Father        Review of Systems:  Review of Systems   Constitutional: Negative for chills, fever and malaise/fatigue.   HENT: Negative for congestion and nosebleeds.    Eyes: Negative for blurred vision and double vision.   Respiratory: Negative.    Cardiovascular: Negative.    Gastrointestinal: Negative.    Genitourinary: Negative.    Musculoskeletal: Negative.    Skin: Negative.    Neurological: Negative.    Endo/Heme/Allergies: Negative.    Psychiatric/Behavioral: Negative.        Vitals:      VITAL SIGNS (all recorded)      Clinic Vitals     Row Name 11/06/16 1325                Amb Encounter Vitals    Weight 81.6 kg (180 lb)    -LB at 11/06/16 1328       Height 1.829 m (6')    -LB at 11/06/16 1328       BMI (Calculated) 24.46    -LB at 11/06/16 1328       BSA (Calculated - sq m) 2.04    -LB at 11/06/16 1328       BP 123/66    -LB at 11/06/16 1328       BP Location Left upper arm    -LB at 11/06/16 1328       Position Sitting    -LB at 11/06/16 1328       Pulse 65    -LB at 11/06/16 1328       Resp 18    -LB at 11/06/16 1328       Temp 36.8 ?C (98.2 ?F)    -LB at 11/06/16 1328       Temperature Source Oral    -LB at 11/06/16 1328       Pain Score Zero    -LB at 11/06/16 1328          Education/Communication Barriers?    Learning/Communication Barriers? No    -LB at 11/06/16 1328          Fall Risk Assessment    Had recent fall / Last 6 months? No recent fall    -LB at 11/06/16 1328       Does patient have a fear of falling? No    -LB at 11/06/16 1328         User Key  (r) = Recorded By, (t) = Taken By, (c) = Cosigned By    Initials Name Effective Dates    LB Kathrynn SpeedBosse, Linda -          Physical Exam:  Physical Exam   Constitutional: He is oriented to person, place, and time and well-developed, well-nourished, and in no distress. No distress.

## 2016-11-09 NOTE — Progress Notes
Teaching/Attestation Statement    I have examined this patient and agree with our resident's assessment and our plan.

## 2017-01-14 ENCOUNTER — Encounter: Attending: Orthopaedic Surgery

## 2018-03-09 ENCOUNTER — Emergency Department (HOSPITAL_BASED_OUTPATIENT_CLINIC_OR_DEPARTMENT_OTHER)
Admission: EM | Admit: 2018-03-09 | Discharge: 2018-03-09 | Disposition: A | Discharge status: Home / Self Care | Payer: Self-pay | Attending: Emergency Medicine | Admitting: Emergency Medicine

## 2018-03-09 ENCOUNTER — Encounter (HOSPITAL_BASED_OUTPATIENT_CLINIC_OR_DEPARTMENT_OTHER): Payer: Self-pay

## 2018-03-09 DIAGNOSIS — H00014 Hordeolum externum left upper eyelid: Secondary | ICD-10-CM | POA: Insufficient documentation

## 2018-03-09 NOTE — ED Provider Notes (Signed)
[facility] EMERGENCY DEPARTMENT Provider Note   CSN: [id number] Arrival date & time: 03/09/18  1034     History   Chief Complaint Chief Complaint  Patient presents with  . Eye Pain    HPI Kenneth Spence is a 22 y.o. male.  HPI   Pt is a 22 y/o male who presents to the ED today c/o left eye pain that has been present for the last 3 days. Pain is constant. States he has swelling to the left eyelid and thinks he has a stye. Has noticed some yellow drainage from the stye. No eye pain or vision changes. No headaches, fevers, or chills. He has tried warm compresses for symptoms.   Past Medical History:  Diagnosis Date  . Cardiac arrest [facility](HCC) 03/1997   Wed patient had chest pain while running in PE, to ED, dehydrated    There are no active problems to display for this patient.   Past Surgical History:  Procedure Laterality Date  . ORCHIOPEXY Bilateral 08/28/2012   Procedure: TESTICULAR TORSION REPAIR;  Surgeon: Judie PetitM. Leonia CoronaShuaib Farooqui, MD;  Location: [facility] OR;  Service: Pediatrics;  Laterality: Bilateral;        Home Medications    Prior to Admission medications   Medication Sig Start Date End Date Taking? Authorizing Provider  HYDROcodone-acetaminophen (NORCO) 5-325 MG per tablet Take 1 tablet by mouth every 6 (six) hours as needed. 11/25/13   [name], David, MD    Family History No family history on file.  Social History Social History   Tobacco Use  . Smoking status: Never Smoker  . Smokeless tobacco: Never Used  Substance Use Topics  . Alcohol use: No  . Drug use: No     Allergies   Penicillins   Review of Systems Review of Systems  Constitutional: Negative for chills and fever.  Eyes: Positive for pain and discharge. Negative for photophobia, redness and visual disturbance.  Neurological: Negative for headaches.     Physical Exam Updated Vital Signs BP 129/76 (BP Location: Left Arm)   Pulse (!) 59   Temp 98.4 F (36.9 C) (Oral)   Resp 18    Ht 5\' 11"  (1.803 m)   Wt 81.6 kg   SpO2 98%   BMI 25.10 kg/m   Physical Exam  Constitutional: He is oriented to person, place, and time. He appears well-developed and well-nourished. No distress.  HENT:  Head: Normocephalic and atraumatic.  Right Ear: External ear normal.  Left Ear: External ear normal.  Nose: Nose normal.  Mouth/Throat: Oropharynx is clear and moist.  bilat TMs WNL.  Eyes: Pupils are equal, round, and reactive to light. Conjunctivae and EOM are normal. Right eye exhibits no discharge. Left eye exhibits no discharge.  Swelling with small nodular mass to left lateral eyelid consistent with hordeolum. No drainage.  Neck: Neck supple.  Cardiovascular: Normal rate.  Pulmonary/Chest: Effort normal.  Neurological: He is alert and oriented to person, place, and time.  Skin: Skin is warm and dry. Capillary refill takes less than 2 seconds.  Psychiatric: He has a normal mood and affect.     ED Treatments / Results  Labs (all labs ordered are listed, but only abnormal results are displayed) Labs Reviewed - No data to display  EKG None  Radiology No results found.  Procedures Procedures (including critical care time)  Medications Ordered in ED Medications - No data to display   Initial Impression / Assessment and Plan / ED Course  I have reviewed  the triage vital signs and the nursing notes.  Pertinent labs & imaging results that were available during my care of the patient were reviewed by me and considered in my medical decision making (see chart for details).    Final Clinical Impressions(s) / ED Diagnoses   Final diagnoses:  Hordeolum of left upper eyelid, unspecified hordeolum type   Physical exam with left upper lid erythema, tenderness and mild swelling without injection of the cornea or conjunctiva, consistent with external hordeolum.  No purulent discharge,entrapment, consensual photophobia.  No induration of the eyelid to suggest periorbital  cellulitis. Presentation non-concerning for iritis, bacterial conjunctivitis, corneal abrasions, or HSV.  discussed use of warm compresses. Personal hygiene and frequent handwashing also discussed.  Patient advised to followup with ophthalmologist if symptoms persist or worsen in any way including vision change or purulent discharge.    It has been determined that no acute conditions requiring further emergency intervention are present at this time. The patient/guardian have been advised of the diagnosis and plan. We have discussed signs and symptoms that warrant return to the ED, such as changes or worsening in symptoms.   Vital signs are stable at discharge.   BP 129/76 (BP Location: Left Arm)   Pulse (!) 59   Temp 98.4 F (36.9 C) (Oral)   Resp 18   Ht 5\' 11"  (1.803 m)   Wt 81.6 kg   SpO2 98%   BMI 25.10 kg/m   Patient/guardian has voiced understanding and agreed to follow-up with the PCP or specialist.        ED Discharge Orders    None       Karrie Meres, PA-C 03/09/18 1123    Melene Plan, DO 03/09/18 1430

## 2018-03-09 NOTE — ED Triage Notes (Signed)
Pt c/o stye to lt upper eye lid

## 2018-03-09 NOTE — Discharge Instructions (Signed)
Use warm compresses on the eye.   Please follow up with your primary care provider within 5-7 days for re-evaluation of your symptoms. If you do not have a primary care provider, information for a healthcare clinic has been provided for you to make arrangements for follow up care. Please return to the emergency department for any new or worsening symptoms.

## 2018-06-07 ENCOUNTER — Emergency Department (HOSPITAL_COMMUNITY): Payer: Self-pay

## 2018-06-07 ENCOUNTER — Emergency Department (HOSPITAL_COMMUNITY)
Admission: EM | Admit: 2018-06-07 | Discharge: 2018-06-08 | Disposition: A | Discharge status: Other Acute Inpatient Hospital | Payer: Self-pay | Attending: Emergency Medicine | Admitting: Emergency Medicine

## 2018-06-07 ENCOUNTER — Other Ambulatory Visit: Payer: Self-pay

## 2018-06-07 DIAGNOSIS — F332 Major depressive disorder, recurrent severe without psychotic features: Secondary | ICD-10-CM | POA: Insufficient documentation

## 2018-06-07 DIAGNOSIS — R4689 Other symptoms and signs involving appearance and behavior: Secondary | ICD-10-CM

## 2018-06-07 DIAGNOSIS — R456 Violent behavior: Secondary | ICD-10-CM | POA: Insufficient documentation

## 2018-06-07 DIAGNOSIS — R45851 Suicidal ideations: Secondary | ICD-10-CM | POA: Insufficient documentation

## 2018-06-07 LAB — CBC WITH DIFFERENTIAL/PLATELET
ABS IMMATURE GRANULOCYTES: 0.02 10*3/uL (ref 0.00–0.07)
BASOS PCT: 0 %
Basophils Absolute: 0 10*3/uL (ref 0.0–0.1)
Eosinophils Absolute: 0 10*3/uL (ref 0.0–0.5)
Eosinophils Relative: 0 %
HEMATOCRIT: 44.3 % (ref 39.0–52.0)
Hemoglobin: 14.5 g/dL (ref 13.0–17.0)
Immature Granulocytes: 0 %
Lymphocytes Relative: 17 %
Lymphs Abs: 1.1 10*3/uL (ref 0.7–4.0)
MCH: 31.6 pg (ref 26.0–34.0)
MCHC: 32.7 g/dL (ref 30.0–36.0)
MCV: 96.5 fL (ref 80.0–100.0)
MONO ABS: 0.3 10*3/uL (ref 0.1–1.0)
MONOS PCT: 5 %
Neutro Abs: 5 10*3/uL (ref 1.7–7.7)
Neutrophils Relative %: 78 %
Platelets: 281 10*3/uL (ref 150–400)
RBC: 4.59 MIL/uL (ref 4.22–5.81)
RDW: 11.9 % (ref 11.5–15.5)
WBC: 6.5 10*3/uL (ref 4.0–10.5)
nRBC: 0 % (ref 0.0–0.2)

## 2018-06-07 LAB — COMPREHENSIVE METABOLIC PANEL
ALT: 22 U/L (ref 0–44)
AST: 36 U/L (ref 15–41)
Albumin: 5 g/dL (ref 3.5–5.0)
Alkaline Phosphatase: 63 U/L (ref 38–126)
Anion gap: 11 (ref 5–15)
BILIRUBIN TOTAL: 0.9 mg/dL (ref 0.3–1.2)
BUN: 10 mg/dL (ref 6–20)
CO2: 22 mmol/L (ref 22–32)
Calcium: 9.6 mg/dL (ref 8.9–10.3)
Chloride: 108 mmol/L (ref 98–111)
Creatinine, Ser: 1.08 mg/dL (ref 0.61–1.24)
GFR calc Af Amer: >60 mL/min (ref >60)
Glucose, Bld: 92 mg/dL (ref 70–99)
Potassium: 3.5 mmol/L (ref 3.5–5.1)
Sodium: 141 mmol/L (ref 135–145)
TOTAL PROTEIN: 7.8 g/dL (ref 6.5–8.1)

## 2018-06-07 LAB — ETHANOL

## 2018-06-07 LAB — ACETAMINOPHEN LEVEL: Acetaminophen (Tylenol), Serum: <10 ug/mL — ABNORMAL LOW (ref 10–30)

## 2018-06-07 LAB — SALICYLATE LEVEL: Salicylate Lvl: <7 mg/dL (ref 2.8–30.0)

## 2018-06-07 MED ORDER — DIPHENHYDRAMINE HCL 50 MG/ML IJ SOLN
25.0000 mg | Freq: Once | INTRAMUSCULAR | Status: AC
Start: 2018-06-07 — End: 2018-06-07
  Administered 2018-06-07: 25 mg via INTRAVENOUS
  Filled 2018-06-07: qty 1

## 2018-06-07 MED ORDER — LORAZEPAM 2 MG/ML IJ SOLN
2.0000 mg | Freq: Once | INTRAMUSCULAR | Status: AC
Start: 2018-06-07 — End: 2018-06-07
  Administered 2018-06-07: 2 mg via INTRAMUSCULAR
  Filled 2018-06-07: qty 1

## 2018-06-07 NOTE — Progress Notes (Signed)
Received Kenneth Spence after shift change in his room asleep. He woke up at 2245 hrs and requested to go home. The IVC status was explained to him. He was tearful and continued to endorse feeling suicidal, anxious and depressed. He verbalized his feeling about suicide will not change during his stay here in the hospital. He  eventually drifted off to sleep.
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
## 2018-06-07 NOTE — ED Notes (Signed)
[employer] continues to be at bedside.

## 2018-06-07 NOTE — ED Provider Notes (Signed)
Level 5 caveat psychiatric complaint.  Patient arrived to hospital hostile aggressive.  Acting physically IVC affidavit filed by Dr. Erma HeritageIsaacs.  He is presently calm and cooperative after treatment with IM Benadryl and Ativan.  He admits to suicidal ideation stating "I have got all kinds of ways to kill myself."  First exam form filled out by me.  Patient medically cleared for psychiatric evaluation   Doug SouJacubowitz, Thoren Hosang, MD 06/07/18 [phone]

## 2018-06-07 NOTE — Progress Notes (Signed)
06/07/18  1535  Attempted to get v/s. Per pt he advised me not to come close because the voices are telling him to do things and he doesn't want to harm me.
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
## 2018-06-07 NOTE — ED Notes (Signed)
Moms number carol is [phone]

## 2018-06-07 NOTE — ED Notes (Signed)
Patient resting in bed.  Calm and cooperative now.  Oriented patient to the unit.  15 minute checks in place and tele-monitoring.

## 2018-06-07 NOTE — ED Notes (Signed)
[id number] Expected date: 06/07/18 Expected time:  Means of arrival:  Comments: Hold for bed 33

## 2018-06-07 NOTE — ED Notes (Signed)
Clinician attempted assessment. Per RN, patient given Ativan and Benadryl, unable to arouse patient to complete assessment.

## 2018-06-08 ENCOUNTER — Inpatient Hospital Stay (HOSPITAL_COMMUNITY)
Admission: AD | Admit: 2018-06-08 | Discharge: 2018-06-10 | DRG: 885 | Disposition: A | Discharge status: Home / Self Care | Payer: Federal, State, Local not specified - Other | Attending: Psychiatry | Admitting: Psychiatry

## 2018-06-08 ENCOUNTER — Encounter (HOSPITAL_COMMUNITY): Payer: Self-pay | Admitting: *Deleted

## 2018-06-08 ENCOUNTER — Other Ambulatory Visit: Payer: Self-pay

## 2018-06-08 DIAGNOSIS — Z880 Allergy status to penicillin: Secondary | ICD-10-CM | POA: Diagnosis not present

## 2018-06-08 DIAGNOSIS — R45851 Suicidal ideations: Secondary | ICD-10-CM | POA: Diagnosis present

## 2018-06-08 DIAGNOSIS — F259 Schizoaffective disorder, unspecified: Secondary | ICD-10-CM | POA: Diagnosis present

## 2018-06-08 DIAGNOSIS — F322 Major depressive disorder, single episode, severe without psychotic features: Secondary | ICD-10-CM | POA: Diagnosis present

## 2018-06-08 DIAGNOSIS — Z8674 Personal history of sudden cardiac arrest: Secondary | ICD-10-CM

## 2018-06-08 DIAGNOSIS — F12159 Cannabis abuse with psychotic disorder, unspecified: Secondary | ICD-10-CM | POA: Diagnosis present

## 2018-06-08 DIAGNOSIS — F250 Schizoaffective disorder, bipolar type: Secondary | ICD-10-CM | POA: Diagnosis not present

## 2018-06-08 LAB — RAPID URINE DRUG SCREEN, HOSP PERFORMED
AMPHETAMINES: POSITIVE — AB
Barbiturates: NOT DETECTED
Benzodiazepines: POSITIVE — AB
Cocaine: NOT DETECTED
OPIATES: NOT DETECTED
Tetrahydrocannabinol: POSITIVE — AB

## 2018-06-08 MED ORDER — OLANZAPINE 5 MG PO TBDP: 5.0000 mg | ORAL_TABLET | Freq: Every day | ORAL | Status: DC

## 2018-06-08 MED ORDER — OLANZAPINE 5 MG PO TBDP
5.0000 mg | ORAL_TABLET | Freq: Every day | ORAL | Status: DC | PRN
  Administered 2018-06-08: 5 mg via ORAL
  Filled 2018-06-08: qty 1

## 2018-06-08 MED ORDER — OLANZAPINE 5 MG PO TBDP: 5.0000 mg | ORAL_TABLET | Freq: Every day | ORAL | Status: DC | PRN

## 2018-06-08 MED ORDER — ACETAMINOPHEN 325 MG PO TABS
650.0000 mg | ORAL_TABLET | Freq: Four times a day (QID) | ORAL | Status: DC | PRN
  Administered 2018-06-08: 650 mg via ORAL
  Filled 2018-06-08: qty 2

## 2018-06-08 MED ORDER — OLANZAPINE 5 MG PO TBDP
5.0000 mg | ORAL_TABLET | Freq: Every day | ORAL | Status: DC
  Administered 2018-06-08: 5 mg via ORAL
  Filled 2018-06-08 (×3): qty 1

## 2018-06-08 MED ORDER — MAGNESIUM HYDROXIDE 400 MG/5ML PO SUSP: 30.0000 mL | Freq: Every day | ORAL | Status: DC | PRN

## 2018-06-08 MED ORDER — ALUM & MAG HYDROXIDE-SIMETH 200-200-20 MG/5ML PO SUSP: 30.0000 mL | ORAL | Status: DC | PRN

## 2018-06-08 NOTE — ED Notes (Signed)
Pt discharged safely with [employer].  Pt was in no distress although he was very irritable .   All belongings were sent with patient.

## 2018-06-08 NOTE — Progress Notes (Signed)
Did not attend group 

## 2018-06-08 NOTE — Tx Team (Signed)
Initial Treatment Plan 06/08/2018 5:35 PM Kenneth Spence [id number]    PATIENT STRESSORS: Marital or family conflict Occupational concerns   PATIENT STRENGTHS: Ability for insight Average or above average intelligence General fund of knowledge   PATIENT IDENTIFIED PROBLEMS: Psychosis "I just want to go home, I'm not suicidal"                     DISCHARGE CRITERIA:  Ability to meet basic life and health needs Improved stabilization in mood, thinking, and/or behavior Verbal commitment to aftercare and medication compliance  PRELIMINARY DISCHARGE PLAN: Attend aftercare/continuing care group Return to previous living arrangement  PATIENT/FAMILY INVOLVEMENT: This treatment plan has been presented to and reviewed with the patient, Kenneth Spence, and/or family member, .  The patient and family have been given the opportunity to ask questions and make suggestions.  Kenneth Spence, Kenneth Spence, [place]RN 06/08/2018, 5:35 PM

## 2018-06-08 NOTE — BH Assessment (Addendum)
Assessment Note  Kenneth Spence is an 22 y.o. male presenting under IVC via [employer] to [facility] ED. Per IVC: "Patient is erratic and aggressive. Patient is suicidal with plan. He is unwilling to participate in interview and is a very high risk of danger to himself and others." Clinician reviewed EDP note for further explanation of IVC.  Per EDP: Patient "presenting today in the custody of [facility] for vandalism.  Patient was allegedly slicing the tires of a car, witnessed by his mother who informed the place of his actions.  Upon [employer] arrival patient was placed under arrest and put into the back of the place vehicle.  Please officer at bedside reports that at that time patient began acting very strangely, staring off into space and intermittently giggling.  Please health reports that the patient then said that he wanted to kill himself.  Patient reports that his plan is to go lay in the street and allow a car to hit him. Upon my evaluation patient is sitting in room rocking back and forth, handcuffs present.  Staring off into space however when spoken to becomes very aggressive and yells.  Patient yelling that he wants to kill himself because he is alone and states that he wants to lay in the street to be hit by car.  Patient states that as soon as he leaves this facility that he will go lay down in the street to kill himself.  While discussing with police officer the patient began laughing hysterically for an unknown reason and daring the police officers to take off his handcuffs." At time of assessment this clinician's assessment patient is drowsy due to Ativan and Benadryl administration the previous evening. Patient states he is here because "they said I'm suicidal." When asked if he is suicidal patient nodded his head and said "yes" but was unable to clarify a plan at this time. 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 Patient is unable to provide details of events that occurred but acknowledges that he vandalized a car and was arrested. Patient admits to threatening suicide to police officers. Patient states he has no history of depression or mental illness. Patient states he has never been in therapy or on psychiatric medications. Patient denies HI/AVH. Patient denies any substance use or criminal charges. Patient denies history of abuse. Patient gave verbal consent for clinician to speak with his mother, Okey Regal. Collateral information was obtained from patient's mother, Okey Regal [phone]): She states patient is under a lot of pressure at work and the previous day was passed up for a promotion, triggering the events that followed. She states he has no history of depression, mental illness, or substance use. He has never been in legal trouble or ever hurt anyone. She states "I know he would never hurt himself or anyone else." Patient was awake but drowsy at time of assessment. He had a body odor and was dressed in scrubs. He was laying down during assessment with a blanket over his body. His speech was soft, almost inaudible. His mood was depressed and his affect was congruent. His thought process was circumstantial. His memory, judgement, insight, and impulse control are all impaired. Patient does not appear to be responding to internal stimuli or experiencing delusional thought content. Per EDP note patient did appear to be responding to internal  stimuli previously.  Per Dr. Sharma CovertNorman and Malachy Chamberakia Starkes, NP patient meets in patient criteria.  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 Cardiac arrest [facility](HCC) 03/1997   Wed patient had chest pain while running in PE, to ED, dehydrated    Past Surgical History:  Procedure Laterality Date  . ORCHIOPEXY  Bilateral 08/28/2012   Procedure: TESTICULAR TORSION REPAIR;  Surgeon: Judie PetitM. Leonia CoronaShuaib Farooqui, MD;  Location: [facility] OR;  Service: Pediatrics;  Laterality: Bilateral;    Family History: No family history on file.  Social History:  reports that he has never smoked. He has never used smokeless tobacco. He reports that he does not drink alcohol or use drugs.  Additional Social History:  Alcohol / Drug Use Pain Medications: see MAR Prescriptions: see MAR Over the Counter: see MAR History of alcohol / drug use?: No history of alcohol / drug abuse Longest period of sobriety (when/how long): patient denies any substance use  CIWA: CIWA-Ar BP: 118/74 Pulse Rate: 74 COWS:    Allergies:  Allergies  Allergen Reactions  . Penicillins Shortness Of Breath, Nausea And Vomiting and Rash    DID THE REACTION INVOLVE: Swelling of the face/tongue/throat, SOB, or low BP? UNK Sudden or severe rash/hives, skin peeling, or the inside of the mouth or nose? UNK Did it require medical treatment? UNK When did it last happen?UNK If all above answers are "NO", may proceed with cephalosporin use.     Home Medications: (Not in a hospital admission)   OB/GYN Status:  No LMP for male patient.  General Assessment Data Assessment unable to be completed: Yes Reason for not completing assessment: (Unable to arouse, Ativan and Benedryl given) Location of Assessment: [facility] ED TTS Assessment: In system Is this a Tele or Face-to-Face Assessment?: Face-to-Face Is this an Initial Assessment or a Re-assessment for this encounter?: Initial Assessment Patient Accompanied by:: N/A Language Other than English: No Living Arrangements: Other (Comment)(apartment ) What gender do you identify as?: Male Marital status: Single Pregnancy Status: No Living Arrangements: Non-relatives/Friends Can pt return to current living arrangement?: Yes Admission Status: Involuntary Petitioner: Family member Is patient capable of signing  voluntary admission?: No Referral Source: Self/Family/Friend     Crisis Care Plan Living Arrangements: Non-relatives/Friends     Risk to self with the past 6 months Suicidal Ideation: Yes-Currently Present Has patient been a risk to self within the past 6 months prior to admission? : Yes Suicidal Intent: Yes-Currently Present Has patient had any suicidal intent within the past 6 months prior to admission? : No Is patient at risk for suicide?: Yes Suicidal Plan?: No-Not Currently/Within Last 6 Months Has patient had any suicidal plan within the past 6 months prior to admission? : Yes Access to Means: Yes Specify Access to Suicidal Means: reports jumping in front of a car What has been your use of drugs/alcohol within the last 12 months?: patient denies Previous Attempts/Gestures: No How many times?: 0 Other Self Harm Risks: none reported Triggers for Past Attempts: None known Intentional Self Injurious Behavior: None Family Suicide History: No Recent stressful life event(s): Job Loss, Legal Issues Persecutory voices/beliefs?: No Depression: Yes Depression Symptoms: Tearfulness, Loss of interest in usual pleasures, Feeling worthless/self pity, Feeling angry/irritable Substance abuse history and/or treatment for substance abuse?: No Suicide prevention information given to non-admitted patients: Not applicable  Risk to Others within the past 6 months Homicidal Ideation: No Does patient  have any lifetime risk of violence toward others beyond the six months prior to admission? : No Thoughts of Harm to Others: No Current Homicidal Intent: No Current Homicidal Plan: No Access to Homicidal Means: No Identified Victim: none reported History of harm to others?: No Assessment of Violence: None Noted Violent Behavior Description: none noted Does patient have access to weapons?: No Criminal Charges Pending?: No Does patient have a court date: No Is patient on probation?:  No  Psychosis Hallucinations: None noted Delusions: None noted  Mental Status Report Appearance/Hygiene: Body odor, In scrubs Eye Contact: Fair Motor Activity: Freedom of movement Speech: Soft, Slurred Level of Consciousness: Drowsy Mood: Depressed Affect: Depressed Anxiety Level: None Thought Processes: Coherent, Relevant Judgement: Impaired Orientation: Person, Place, Time, Situation Obsessive Compulsive Thoughts/Behaviors: None  Cognitive Functioning Concentration: Poor Memory: Recent Impaired, Remote Impaired Is patient IDD: No Insight: Poor Impulse Control: Poor Appetite: (UTA) Have you had any weight changes? : No Change Sleep: Unable to Assess Total Hours of Sleep: (UTA) Vegetative Symptoms: None  ADLScreening [facility] Assessment Services) Patient's cognitive ability adequate to safely complete daily activities?: Yes Patient able to express need for assistance with ADLs?: Yes Independently performs ADLs?: Yes (appropriate for developmental age)  Prior Inpatient Therapy Prior Inpatient Therapy: No  Prior Outpatient Therapy Prior Outpatient Therapy: No Does patient have an ACCT team?: No Does patient have Intensive In-House Services?  : No Does patient have [facility] services? : No Does patient have P4CC services?: No  ADL Screening (condition at time of admission) Patient's cognitive ability adequate to safely complete daily activities?: Yes Is the patient deaf or have difficulty hearing?: No Does the patient have difficulty seeing, even when wearing glasses/contacts?: No Does the patient have difficulty concentrating, remembering, or making decisions?: No Patient able to express need for assistance with ADLs?: Yes Does the patient have difficulty dressing or bathing?: No Independently performs ADLs?: Yes (appropriate for developmental age) Does the patient have difficulty walking or climbing stairs?: No Weakness of Legs: None Weakness of Arms/Hands:  None  Home Assistive Devices/Equipment Home Assistive Devices/Equipment: None  Therapy Consults (therapy consults require a physician order) PT Evaluation Needed: No OT Evalulation Needed: No SLP Evaluation Needed: No Abuse/Neglect Assessment (Assessment to be complete while patient is alone) Abuse/Neglect Assessment Can Be Completed: Yes Physical Abuse: Denies Verbal Abuse: Denies Sexual Abuse: Denies Exploitation of patient/patient's resources: Denies Self-Neglect: Denies Values / Beliefs Cultural Requests During Hospitalization: None Spiritual Requests During Hospitalization: None Consults Spiritual Care Consult Needed: No Social Work Consult Needed: No Merchant navy officer (For Healthcare) Does Patient Have a Medical Advance Directive?: No Would patient like information on creating a medical advance directive?: No - Patient declined          Disposition: Per Dr. Sharma Covert patient meets in patient criteria. Disposition Initial Assessment Completed for this Encounter: Yes  On Site Evaluation by:   Reviewed with Physician:    Celedonio Miyamoto 06/08/2018 7:52 AM

## 2018-06-08 NOTE — Progress Notes (Signed)
Kenneth Spence is a [age] year old male pt admitted on involuntary basis. On admission, he spoke about how he is ready to leave and how there's nothing wrong with him. He denied any SI and reports that he has never been suicidal. He spoke about how the police got him and his mother told them that I needed to go get help but reports that he does not need any help. He denies that he vandalized his boss vehicle. He reports that he has never been on medications in the past. He reports that lives with a roommate and will go back there after discharge. Kenneth Spence was irritable and argumentative and kept repeating himself about not needing to be here and wanting to go. He was able to respond appropriately to re-direction and was escorted to the unit without incident, oriented to the milieu and safety maintained.

## 2018-06-08 NOTE — BH Assessment (Signed)
[facility]  Per Juanetta BeetsJacqueline Norman, DO, this pt requires psychiatric hospitalization.  Berneice Heinrichina Tate, RN, [facility]; [facility] will be ready to receive pt at 16:00.  Pt presents under IVC initiated by EDP Shaune Pollackameron Isaacs, MD, and [facility].  Pt's nurse, Kendal Hymendie, has been notified, and agrees to call report to [phone].  Pt is to be transported via [employer] examinerlaw enforcement.   Doylene Canninghomas Lexxus Underhill, [place] Behavioral Health Coordinator [phone]

## 2018-06-08 NOTE — Progress Notes (Signed)
Clinician attempted to see pt. Pt cannot be aroused still due to medications administered.

## 2018-06-08 NOTE — Progress Notes (Signed)
Pt noted to be irritable on approach. Pt was escorted to his room and refused to go into the room with a roommate because he can't trust anyone. Pt stated that he doesn't need to be here in the hospital and expressed that he's not crazy. Pt repeatedly requested to be release.

## 2018-06-09 DIAGNOSIS — F250 Schizoaffective disorder, bipolar type: Secondary | ICD-10-CM

## 2018-06-09 LAB — LIPID PANEL
CHOL/HDL RATIO: 4.1 ratio
Cholesterol: 171 mg/dL (ref 0–200)
HDL: 42 mg/dL (ref >40)
LDL Cholesterol: 115 mg/dL — ABNORMAL HIGH (ref 0–99)
Triglycerides: 70 mg/dL (ref <150)
VLDL: 14 mg/dL (ref 0–40)

## 2018-06-09 LAB — TSH: TSH: 1.226 u[IU]/mL (ref 0.350–4.500)

## 2018-06-09 MED ORDER — LORAZEPAM 1 MG PO TABS: 2.0000 mg | ORAL_TABLET | ORAL | Status: DC | PRN

## 2018-06-09 MED ORDER — ZIPRASIDONE MESYLATE 20 MG IM SOLR: 20.0000 mg | Freq: Four times a day (QID) | INTRAMUSCULAR | Status: DC | PRN

## 2018-06-09 MED ORDER — PRENATAL MULTIVITAMIN CH
1.0000 | ORAL_TABLET | Freq: Every day | ORAL | Status: DC
  Filled 2018-06-09 (×2): qty 1

## 2018-06-09 MED ORDER — OMEGA-3-ACID ETHYL ESTERS 1 G PO CAPS
1.0000 g | ORAL_CAPSULE | Freq: Two times a day (BID) | ORAL | Status: DC
  Administered 2018-06-10: 1 g via ORAL
  Filled 2018-06-09 (×5): qty 1

## 2018-06-09 MED ORDER — OLANZAPINE 10 MG PO TBDP
10.0000 mg | ORAL_TABLET | Freq: Every day | ORAL | Status: DC
  Administered 2018-06-09: 10 mg via ORAL
  Filled 2018-06-09 (×2): qty 1

## 2018-06-09 MED ORDER — LORAZEPAM 2 MG/ML IJ SOLN: 2.0000 mg | INTRAMUSCULAR | Status: DC | PRN

## 2018-06-09 NOTE — Progress Notes (Signed)
Pt spoke with Clinical research associatewriter and apologized for what he said earlier. Pt stated that he doesn't need to be here in the hospital and he's not crazy. Pt expressed that he wasn't serious earlier when he said he would rather jump out of the window than be here in the hospital Pt stated "I would never try to hurt myself. I was frustrated because I don't need to be here". Pt noted to be appropriate this evening and no inappropriate or aggressive behaviors noted.

## 2018-06-09 NOTE — Progress Notes (Signed)
Recreation Therapy Notes  Date: 12.19.19 Time: 1000 Location:  500 Hall Dayroom  Group Topic: Communication, Team Building, Problem Solving  Goal Area(s) Addresses:  Patient will effectively work with peer towards shared goal.  Patient will identify skills used to make activity successful.  Patient will identify how skills used during activity can be used to reach post d/c goals.   Intervention: STEM Activity  Activity: Landing Pad. In teams patients were given 12 plastic drinking straws and a length of masking tape. Using the materials provided patients were asked to build a landing pad to catch a golf ball dropped from approximately 6 feet in the air.   Education: Social Skills, Discharge Planning   Education Outcome: Acknowledges education/In group clarification offered/Needs additional education.   Clinical Observations/Feedback: Pt did not attend group.    Kiptyn Rafuse, LRT/CTRS         Glennie Bose A 06/09/2018 12:00 PM 

## 2018-06-09 NOTE — Progress Notes (Signed)
Pt agitated and threatening to leave. Pt repeatedly asked to be discharged and stated that he will jump out the window if he have to stay here. A unit restriction was ordered for elopement risk and pt safety. Pt offered medications for agitation but declined.  Dr. Jeannine KittenFarah informed of the pt's current status. 15 Minute checks performed for safety.

## 2018-06-09 NOTE — Progress Notes (Signed)
Adult Psychoeducational Group Note  Date:  06/09/2018 Time:  8:49 PM  Group Topic/Focus:  Wrap-Up Group:   The focus of this group is to help patients review their daily goal of treatment and discuss progress on daily workbooks.  Participation Level:  Active  Participation Quality:  Appropriate  Affect:  Appropriate  Cognitive:  Appropriate  Insight: Appropriate  Engagement in Group:  Engaged  Modes of Intervention:  Discussion  Additional Comments: The patient expressed that he rates today a 9.  Octavio Mannshigpen, Shadd Dunstan Lee 06/09/2018, 8:49 PM

## 2018-06-09 NOTE — BHH Suicide Risk Assessment (Signed)
[facility]   Nursing information obtained from:  Patient Demographic factors:  Male, Adolescent or young adult, Low socioeconomic status, Unemployed Current Mental Status:  NA Loss Factors:  Decrease in vocational status Historical Factors:  NA Risk Reduction Factors:  Positive coping skills or problem solving skills  Total Time spent with patient: 45 minutes Principal Problem: Recent vandalism in the context of substance abuse and psychosis Diagnosis:  Active Problems:   Schizoaffective psychosis (HCC)  Subjective Data: New onset bizarre and psychotic symptoms in the presence of positive drug screen  Continued Clinical Symptoms:  Alcohol Use Disorder Identification Test Final Score (AUDIT): 0 The "Alcohol Use Disorders Identification Test", Guidelines for Use in Primary Care, Second Edition.  World Science writerHealth Organization [facility](WHO). Score between 0-7:  no or low risk or alcohol related problems. Score between 8-15:  moderate risk of alcohol related problems. Score between 16-19:  high risk of alcohol related problems. Score 20 or above:  warrants further diagnostic evaluation for alcohol dependence and treatment.   CLINICAL FACTORS:   Schizophrenia:   Paranoid or undifferentiated type       COGNITIVE FEATURES THAT CONTRIBUTE TO RISK:  None    SUICIDE RISK:   Minimal: No identifiable suicidal ideation.  Patients presenting with no risk factors but with morbid ruminations; may be classified as minimal risk based on the severity of the depressive symptoms  PLAN OF CARE: Continue to seek diagnostic clarity and treat psychosis monitor for withdrawal  I certify that inpatient services furnished can reasonably be expected to improve the patient's condition.   Kenneth JohnsFARAH,Kenneth Junco, MD 06/09/2018, 9:57 AM

## 2018-06-09 NOTE — Progress Notes (Signed)
Adult Psychoeducational Group Note  Date:  06/09/2018 Time:  5:18 PM  Group Topic/Focus:  Relaxation Group, Music and Christmas Cards Decorating    Participation Level:  Did Not Attend  Layla BarterWhite, Zaniel Marineau L

## 2018-06-09 NOTE — Progress Notes (Signed)
D:  Kenneth Spence was in his room all shift.  He did not attend evening wrap up group.  He denied SI/HI or A/V hallucinations.  He did state "I'm not crazy and I don't need to be here."  Minimal insight and is focused on speaking with the doctor so he can leave.  He denied pain or discomfort and appeared to be in no physical distress.  He did take his hs medication with much encouragement.  He did not want to talk about what happened prior to coming to the hospital.  He was noted sleeping well tonight.  A:  1:1 with RN for support and encouragement.  Medications as ordered.  Q 15 minute checks maintained for safety.  Encouraged participation in group and unit activities.   R:  Kenneth Spence remains safe on the unit.  We will continue to monitor the progress towards his goals.

## 2018-06-09 NOTE — Progress Notes (Signed)
Recreation Therapy Notes  INPATIENT RECREATION THERAPY ASSESSMENT  Patient Details Name: Kenneth Spence MRN: [id number] DOB: 06-27-1995 Today's Date: 06/09/2018       Information Obtained From: Chart Review  Able to Participate in Assessment/Interview:    Patient Presentation: (Pt was agitated)  Reason for Admission (Per Patient): (Per chart: psychosis)  Patient Stressors: Work, Orthoptistamily  Coping Skills:   (None identified)  Leisure Interests (2+):  (None identified)  Frequency of Recreation/Participation: (Not identified)  Awareness of Community Resources:  (Not identified)  Programmer, applicationsCommunity Resources:     Current Use:    If no, Barriers?:    Expressed Interest in State Street CorporationCommunity Resource Information: (Not identified)  [place]  Patient Main Form of Transportation: (Not identified)  Patient Strengths:  Per chart: Ability for insight, General knowledge  Patient Identified Areas of Improvement:  None identified  Patient Goal for Hospitalization:  None identified  Current SI (including self-harm):  (Not identified)  Current HI:  (Not identified)  Current AVH: (Not identified)  Staff Intervention Plan: Group Attendance, Collaborate with Interdisciplinary Treatment Team  Consent to Intern Participation: N/A    Caroll RancherMarjette Harveen Flesch, LRT/CTRS  Lillia AbedLindsay, [place] A 06/09/2018, 12:08 PM

## 2018-06-09 NOTE — H&P (Addendum)
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 He presented with a UDS positive for marijuana, benzodiazepines and amphetamines but on interview states he does not know where to find amphetamines and would have trouble finding it, yet does indeed abuse marijuana and does not know what benzodiazepines are.  Since admission he has certainly recalibrated to what appears to be a baseline status -as he is alert, oriented to person place time situation and day not date, denies hallucinations denies wanting to harm self or others, however what brought him here was the following history. Patient had vandalized his bosses car actually slashing the tires and his mother was present and confirmed this was true and [facility] police were going to take the patient into custody when he began behaving bizarrely and erratic - described as staring into space and giggling and was then taken to the emergency department- where he further stated he wanted to kill himself -  under further evaluation became hostile and aggressive requiring petition for involuntary commitment and IM medications Once under observation in the emergency department, endorsed suicidal thinking as recently as 7:50 PM on the evening of 12/17 By the next day he had no recall of vandalizing the vehicle described, he was focused on discharge was irritable and argumentative on the 18th today he is more cooperative again insisting he does not abuse alcohol or drugs and insisting there is nothing wrong  with him.  Associated Signs/Symptoms: Depression Symptoms:  disturbed sleep, (Hypo) Manic Symptoms:  Distractibility, Anxiety Symptoms:  n/a Psychotic Symptoms:  Paranoia, PTSD Symptoms: NA Total Time spent with patient: 45 minutes  Past Psychiatric History: Denied  Is the patient at risk to self? Yes.    Has the patient been a risk to self in the past 6 months? No.  Has the patient been a risk to self within the distant past? No.  Is the patient a risk to others? No.  Has the patient been a risk to others in the past 6 months? No.  Has the patient been a risk to others within the distant past? No.   Prior Inpatient Therapy:   Prior Outpatient Therapy:    Alcohol Screening: 1. How often do you have a drink containing alcohol?: Never 2. How many drinks containing alcohol do you have on a typical day when you are drinking?: 1 or 2 3. How often do you have six or more drinks on one occasion?: Never AUDIT-C Score: 0 4. How often during the last year have you found that you were not able to stop drinking once you had started?: Never 5. How often during the last year have you failed to do what was normally expected from you becasue of drinking?: Never 6. How often during the last year have you needed a first drink in the morning to get yourself going after a heavy drinking session?: Never 7. How often during the last year have you had a feeling of guilt of remorse after drinking?: Never 8. How often during the last year have you been unable to remember what happened the night before because you had been drinking?: Never 9. Have you  or someone else been injured as a result of your drinking?: No 10. Has a relative or friend or a doctor or another health worker been concerned about your drinking or suggested you cut down?: No Alcohol Use Disorder Identification Test Final Score (AUDIT): 0 Intervention/Follow-up: AUDIT Score <7 follow-up not indicated Substance Abuse History in the last 12  months:  No. Consequences of Substance Abuse: Medical Consequences:  Probable drug-induced psychosis Previous Psychotropic Medications: No  Psychological Evaluations: No  Past Medical History:  Past Medical History:  Diagnosis Date  . Cardiac arrest [facility]) 03/1997   Wed patient had chest pain while running in PE, to ED, dehydrated    Past Surgical History:  Procedure Laterality Date  . ORCHIOPEXY Bilateral 08/28/2012   Procedure: TESTICULAR TORSION REPAIR;  Surgeon: Judie Petit. Leonia Corona, MD;  Location: [facility] OR;  Service: Pediatrics;  Laterality: Bilateral;   Family History: History reviewed. No pertinent family history. Family Psychiatric  History:  Tobacco Screening: Have you used any form of tobacco in the last 30 days? (Cigarettes, Smokeless Tobacco, Cigars, and/or Pipes): No Social History:  Social History   Substance and Sexual Activity  Alcohol Use No     Social History   Substance and Sexual Activity  Drug Use No    Additional Social History:                           Allergies:   Allergies  Allergen Reactions  . Penicillins Shortness Of Breath, Nausea And Vomiting and Rash    DID THE REACTION INVOLVE: Swelling of the face/tongue/throat, SOB, or low BP? UNK Sudden or severe rash/hives, skin peeling, or the inside of the mouth or nose? UNK Did it require medical treatment? UNK When did it last happen?UNK If all above answers are "NO", may proceed with cephalosporin use.    Lab Results:  Results for orders placed or performed during the hospital encounter of 06/07/18 (from the past 48 hour(s))  Comprehensive metabolic panel     Status: None   Collection Time: 06/07/18  4:50 PM  Result Value Ref Range   Sodium 141 135 - 145 mmol/L   Potassium 3.5 3.5 - 5.1 mmol/L   Chloride 108 98 - 111 mmol/L   CO2 22 22 - 32 mmol/L   Glucose, Bld 92 70 - 99 mg/dL   BUN 10 6 - 20 mg/dL   Creatinine, Ser 1.61 0.61 - 1.24 mg/dL   Calcium 9.6 8.9 - 09.6 mg/dL    Total Protein 7.8 6.5 - 8.1 g/dL   Albumin 5.0 3.5 - 5.0 g/dL   AST 36 15 - 41 U/L   ALT 22 0 - 44 U/L   Alkaline Phosphatase 63 38 - 126 U/L   Total Bilirubin 0.9 0.3 - 1.2 mg/dL   GFR calc non Af Amer >60 >60 mL/min   GFR calc Af Amer >60 >60 mL/min   Anion gap 11 5 - 15    Comment: Performed at [facility], [address] [place], [place] [address]  Ethanol     Status: None   Collection Time: 06/07/18  4:50 PM  Result Value Ref Range   Alcohol, Ethyl (B) <10 <10 mg/dL    Comment: (NOTE) Lowest detectable limit for serum alcohol is 10 mg/dL. For medical purposes only. Performed at [facility], [address] [place], [place] [address]   CBC with Diff     Status: None  Collection Time: 06/07/18  4:50 PM  Result Value Ref Range   WBC 6.5 4.0 - 10.5 K/uL   RBC 4.59 4.22 - 5.81 MIL/uL   Hemoglobin 14.5 13.0 - 17.0 g/dL   HCT 16.144.3 09.639.0 - 04.552.0 %   MCV 96.5 80.0 - 100.0 fL   MCH 31.6 26.0 - 34.0 pg   MCHC 32.7 30.0 - 36.0 g/dL   RDW 40.911.9 81.111.5 - 91.415.5 %   Platelets 281 150 - 400 K/uL   nRBC 0.0 0.0 - 0.2 %   Neutrophils Relative % 78 %   Neutro Abs 5.0 1.7 - 7.7 K/uL   Lymphocytes Relative 17 %   Lymphs Abs 1.1 0.7 - 4.0 K/uL   Monocytes Relative 5 %   Monocytes Absolute 0.3 0.1 - 1.0 K/uL   Eosinophils Relative 0 %   Eosinophils Absolute 0.0 0.0 - 0.5 K/uL   Basophils Relative 0 %   Basophils Absolute 0.0 0.0 - 0.1 K/uL   Immature Granulocytes 0 %   Abs Immature Granulocytes 0.02 0.00 - 0.07 K/uL    Comment: Performed at [facility], [address] [place], [place] [address]  Salicylate level     Status: None   Collection Time: 06/07/18  4:50 PM  Result Value Ref Range   Salicylate Lvl <7.0 2.8 - 30.0 mg/dL    Comment: Performed at [facility], [address] [place], [place] [address]  Acetaminophen level     Status: Abnormal   Collection Time: 06/07/18  4:50 PM  Result Value Ref Range    Acetaminophen (Tylenol), Serum <10 (L) 10 - 30 ug/mL    Comment: (NOTE) Therapeutic concentrations vary significantly. A range of 10-30 ug/mL  may be an effective concentration for many patients. However, some  are best treated at concentrations outside of this range. Acetaminophen concentrations >150 ug/mL at 4 hours after ingestion  and >50 ug/mL at 12 hours after ingestion are often associated with  toxic reactions. Performed at [facility], [address] [place], [place] [address]   Rapid urine drug screen (hospital performed)     Status: Abnormal   Collection Time: 06/08/18  8:45 AM  Result Value Ref Range   Opiates NONE DETECTED NONE DETECTED   Cocaine NONE DETECTED NONE DETECTED   Benzodiazepines POSITIVE (A) NONE DETECTED   Amphetamines POSITIVE (A) NONE DETECTED   Tetrahydrocannabinol POSITIVE (A) NONE DETECTED   Barbiturates NONE DETECTED NONE DETECTED    Comment: (NOTE) DRUG SCREEN FOR MEDICAL PURPOSES ONLY.  IF CONFIRMATION IS NEEDED FOR ANY PURPOSE, NOTIFY LAB WITHIN 5 DAYS. LOWEST DETECTABLE LIMITS FOR URINE DRUG SCREEN Drug Class                     Cutoff (ng/mL) Amphetamine and metabolites    1000 Barbiturate and metabolites    200 Benzodiazepine                 200 Tricyclics and metabolites     300 Opiates and metabolites        300 Cocaine and metabolites        300 THC                            50 Performed at [facility], [address] [place], [place] [address]     Blood Alcohol level:  Lab Results  Component Value Date   ETH <  10 06/07/2018    Metabolic Disorder Labs:  No results found for: HGBA1C, MPG No results found for: PROLACTIN No results found for: CHOL, TRIG, HDL, CHOLHDL, VLDL, LDLCALC  Current Medications: Current Facility-Administered Medications  Medication Dose Route Frequency Provider Last Rate Last Dose  . acetaminophen (TYLENOL) tablet 650 mg  650 mg Oral Q6H PRN Maryagnes Amos, FNP   650 mg at 06/08/18 1748  . alum & mag hydroxide-simeth (MAALOX/MYLANTA) 200-200-20 MG/5ML suspension 30 mL  30 mL Oral Q4H PRN Rosario Adie, Juel Burrow, FNP      . LORazepam (ATIVAN) tablet 2 mg  2 mg Oral Q4H PRN Malvin Johns, MD       Or  . LORazepam (ATIVAN) injection 2 mg  2 mg Intramuscular Q4H PRN Malvin Johns, MD      . magnesium hydroxide (MILK OF MAGNESIA) suspension 30 mL  30 mL Oral Daily PRN Rosario Adie, Juel Burrow, FNP      . OLANZapine zydis (ZYPREXA) disintegrating tablet 10 mg  10 mg Oral QHS Malvin Johns, MD      . OLANZapine zydis (ZYPREXA) disintegrating tablet 5 mg  5 mg Oral Daily PRN Maryagnes Amos, FNP      . omega-3 acid ethyl esters (LOVAZA) capsule 1 g  1 g Oral BID Malvin Johns, MD      . prenatal multivitamin tablet 1 tablet  1 tablet Oral Q1200 Malvin Johns, MD      . ziprasidone (GEODON) injection 20 mg  20 mg Intramuscular Q6H PRN Malvin Johns, MD       PTA Medications: No medications prior to admission.    Musculoskeletal: Strength & Muscle Tone: within normal limits Gait & Station: normal Patient leans: N/A  Psychiatric Specialty Exam: Physical Exam  ROS  Blood pressure 114/90, pulse 95, temperature 97.8 F (36.6 C), temperature source Oral, resp. rate 18, height 5\' 10"  (1.778 m), weight 81.6 kg.Body mass index is 25.83 kg/m.  General Appearance: Fairly Groomed  Eye Contact:  Good  Speech:  Clear and Coherent  Volume:  Normal  Mood:  Euthymic  Affect:  Constricted  Thought Process:  Goal Directed  Orientation:  Full (Time, Place, and Person)  Thought Content:  Logical  Suicidal Thoughts:  No  Homicidal Thoughts:  No  Memory:  Immediate;   Fair  Judgement:  Fair  Insight:  Fair  Psychomotor Activity:  Normal  Concentration:  Concentration: Fair  Recall:  Fair  Fund of Knowledge:  Good  Language:  Good  Akathisia:  neg  Handed:  Right  AIMS (if indicated):     Assets:  Communication Skills Desire for Improvement   ADL's:  Intact  Cognition:  WNL  Sleep:  Number of Hours: 4.25    Treatment Plan Summary: Daily contact with patient to assess and evaluate symptoms and progress in treatment and Medication management  Observation Level/Precautions:  15 minute checks  Laboratory:  UDS  Psychotherapy: Cognitive and reality based  Medications: Zyprexa and neuro protection  Consultations: Not needed  Discharge Concerns: Drug abstinence  Estimated LOS: 2 days  Other: Seek diagnostic clarity   Physician Treatment Plan for Primary Diagnosis: <principal problem not specified> Long Term Goal(s): Improvement in symptoms so as ready for discharge  Short Term Goals: Ability to identify changes in lifestyle to reduce recurrence of condition will improve and Ability to verbalize feelings will improve  Physician Treatment Plan for Secondary Diagnosis: Active Problems:   Schizoaffective psychosis (HCC)  Long Term Goal(s): Improvement in  symptoms so as ready for discharge  Short Term Goals: Compliance with prescribed medications will improve and Ability to identify triggers associated with substance abuse/mental health issues will improve   In summary, new onset bizarre behaviors erratic behaviors and vandalism in a 22 year old patient who though interview twice continues to deny drugs of abuse other than cannabis, has a drug screen positive for benzodiazepines and amphetamines.  Therefore we are hopeful this is just a drug-induced psychosis that will resolve and stay resolved rather than schizophreniform presentation.  We will continue current Zyprexa continue current precautions neuroprotective measures gather history from family   I certify that inpatient services furnished can reasonably be expected to improve the patient's condition.    Malvin JohnsFARAH,Dorthy Magnussen, MD 12/19/20199:50 AM

## 2018-06-09 NOTE — [facility] Suicide Risk Assessment (Signed)
[facility] INPATIENT:  Family/Significant Other Suicide Prevention Education  Suicide Prevention Education:  Education Completed; Bunnie PionCarol Lewis (pt's mother) [phone] has been identified by the patient as the family member/significant other with whom the patient will be residing, and identified as the person(s) who will aid the patient in the event of a mental health crisis (suicidal ideations/suicide attempt).  With written consent from the patient, the family member/significant other has been provided the following suicide prevention education, prior to the and/or following the discharge of the patient.  The suicide prevention education provided includes the following:  Suicide risk factors  Suicide prevention and interventions  National Suicide Hotline telephone number  [facility] assessment telephone number  [facility] Emergency Assistance 911  [facility] and/or Residential Mobile Crisis Unit telephone number  Request made of family/significant other to:  Remove weapons (e.g., guns, rifles, knives), all items previously/currently identified as safety concern.    Remove drugs/medications (over-the-counter, prescriptions, illicit drugs), all items previously/currently identified as a safety concern.  The family member/significant other verbalizes understanding of the suicide prevention education information provided.  The family member/significant other agrees to remove the items of safety concern listed above.  SPE and aftercare reviewed with pt's mother. Pt's mother shared that pt was demoted at work and has been under extreme stress but fully functioning up until this admission. "he has his own place and car." Pt's mother feels that pt is stable for discharge on Friday morning and would like to pick him up in the morning to take him to [place] for family visit. MD made aware of above. Pt denies SI/HI/AVH during assessment today. Pt's mother has no safety concerns regarding  pt discharging into her care on Friday.   Rona RavensHeather S Tami Barren LCSW 06/09/2018, 1:38 PM

## 2018-06-09 NOTE — [facility] Counselor (Signed)
Adult Comprehensive Assessment  Patient ID: Kenneth Spence, male   DOB: [birth year], 22 y.o.   MRN: [id number]  Information Source: Information source: Patient  Current Stressors:  Patient states their primary concerns and needs for treatment are:: "I think I had nervous breakdown. I blacked out because I was so mad."  Patient states their goals for this hospitilization and ongoing recovery are:: "I want to go home. I'm not crazy."  Educational / Learning stressors: high school graduate Employment / Job issues: employed at a Education officer, [employer]. 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 pt reports he does not have furniture in apartment yet Physical health (include injuries & life threatening diseases): none identified Social relationships: strong family support; lives with his best friend Substance abuse: pt acknoleges marijuana use 1x daily ongoing use. per UDS, pt pos for amphetamines, benzos, and THC. pt continues to deny further drug use.  Bereavement / Loss: none identified   Living/Environment/Situation:  Living Arrangements: Non-relatives/Friends Living conditions (as described by patient or guardian): lives in apartment (since 02/2018) with best friend Who else lives in the home?: best friend How long has patient lived in current situation?: 02/2018 What is atmosphere in current home: Comfortable, Supportive  Family History:  Marital status: Single Are you sexually active?: Yes What is your sexual orientation?: heterosexual Has your sexual activity been affected by drugs, alcohol, medication, or emotional stress?: no.  Does patient have children?: No  Childhood History:  By whom was/is the patient raised?:  Mother Additional childhood history information: mother was primary caregiver; pt denies any family history of substance abuse or mental illness Description of patient's relationship with caregiver when they were a child: close to mother; unknown relationship with father Patient's description of current relationship with people who raised him/her: close to mother; unknown relationship with father How were you disciplined when you got in trouble as a child/adolescent?: n/a  Does patient have siblings?: Yes Number of Siblings: 1 Description of patient's current relationship with siblings: pt reports several siblings but only mentioned his little sister-18yo. "we are close."  Did patient suffer any verbal/emotional/physical/sexual abuse as a child?: No Did patient suffer from severe childhood neglect?: No Has patient ever been sexually abused/assaulted/raped as an adolescent or adult?: No Was the patient ever a victim of a crime or a disaster?: No Witnessed domestic violence?: No Has patient been effected by domestic violence as an adult?: No  Education:  Highest grade of school patient has completed: high school graduate Currently a Consulting civil engineerstudent?: No Learning disability?: No  Employment/Work Situation:   Employment situation: Employed 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 pt cut tires of boss prior to admission.  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?: No Are There Guns or Other Weapons in Your Home?: (n/a) Are These Weapons Safely Secured?: Yes Who Could Verify You Are Able To Have These  Secured::  n/a  Financial Resources:   Financial resources: Income from employment Does patient have a representative payee or guardian?: No  Alcohol/Substance Abuse:   What has been your use of drugs/alcohol within the last 12 months?: marijuana use one time daily per pt. pt denies any other drug use but UDS positive for amphetamines and benzos as well.  If attempted suicide, did drugs/alcohol play a role in this?: No(pt denies; however, pt IVCed due to suicidal statements made to police officers) Alcohol/Substance Abuse Treatment Hx: Denies past history If yes, describe treatment: n/a  Has alcohol/substance abuse ever caused legal problems?: No  Social Support System:   Patient's Community Support System: Good Describe Community Support System: strong family supports and lives with best friend Type of faith/religion: n/a  How does patient's faith help to cope with current illness?: n/a   Leisure/Recreation:   Leisure and Hobbies: "I work all the time. I don't have much time for anything else."   Strengths/Needs:   What is the patient's perception of their strengths?: hard worker; up until this point, had been funtioning well-maintaining employment, an apt, and a car. family support Patient states they can use these personal strengths during their treatment to contribute to their recovery: pt [facility].  Patient states these barriers may affect/interfere with their treatment: none identified Patient states these barriers may affect their return to the community: none identified Other important information patient would like considered in planning for their treatment: "I want to go home. I don't belong here."   Discharge Plan:   Currently receiving community mental health services: No Patient states concerns and preferences for aftercare planning are: pt is [facility].  Patient states they will know when  they are safe and ready for discharge when: "I'm ready to discharge now."  Does patient have access to transportation?: Yes(pt's mother wants to pick him up Friday morning (11am)) Does patient have financial barriers related to discharge medications?: Yes Patient description of barriers related to discharge medications: limited income and no insurance Will patient be returning to same living situation after discharge?: Yes(pt plans to return home at discharge)  Summary/Recommendations:   Summary and Recommendations (to be completed by the evaluator): Patient [place] 22yo [place], [place] Pt presents to the hospital IVC due to bizarre behaviors, SI statements, and was allegedly cutting tires at his work when police arrived. Pt denies SI/HI/AVH currently. Pt reports daily marijuana use and denies all other drug use--however, UDS positive for amphetamines, benzodiazapines, and THC. Pt has a diagnosis of MDD, single episode, severe and Brief Psychotic disorder. Recommendations for pt include: crisis stabilization, therapeutic milieu, encourage group attendance and participation, medication management for mood stabilization, and development of comprehensive mental wellness/sobriety plan. CSW assessing. [facility] and plans to return home at discharge.    Rona RavensHeather S Aleen Marston LCSW 06/09/2018 1:58 PM

## 2018-06-10 LAB — HEMOGLOBIN A1C
Hgb A1c MFr Bld: 4.2 % — ABNORMAL LOW (ref 4.8–5.6)
MEAN PLASMA GLUCOSE: 73.84 mg/dL

## 2018-06-10 MED ORDER — ENLYTE PO CAPS: ORAL_CAPSULE | ORAL | 5 refills | Status: DC

## 2018-06-10 MED ORDER — OMEGA-3-ACID ETHYL ESTERS 1 G PO CAPS: 1.0000 g | ORAL_CAPSULE | Freq: Two times a day (BID) | ORAL | 5 refills | Status: AC

## 2018-06-10 MED ORDER — OLANZAPINE 5 MG PO TABS: 5.0000 mg | ORAL_TABLET | Freq: Every day | ORAL | 0 refills | Status: DC

## 2018-06-10 MED ORDER — OLANZAPINE 5 MG PO TABS
5.0000 mg | ORAL_TABLET | Freq: Every day | ORAL | Status: DC
  Filled 2018-06-10: qty 7
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
## 2018-06-10 NOTE — [facility] Suicide Risk Assessment (Signed)
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 rate 18, height 5\' 10"  (1.778 m), weight 81.6 kg.Body mass index is 25.83 kg/m.  General Appearance: Casual  Eye Contact:  Good  Speech:  Clear and Coherent  Volume:  Normal  Mood:  Euthymic  Affect:  Appropriate  Thought Process:  Goal Directed  Orientation:  Full (Time, Place, and Person)  Thought Content:  Logical  Suicidal Thoughts:  No  Homicidal Thoughts:  No  Memory:  Immediate;   Good  Judgement:  Good  Insight:  Good  Psychomotor Activity:  Normal  Concentration:  Concentration: Good  Recall:  Good  Fund of Knowledge:  Good  Language:  Good  Akathisia:  Negative  Handed:  Right  AIMS (if indicated):     Assets:  Communication Skills Desire for Improvement  ADL's:  Intact  Cognition:  WNL  Sleep:  Number of Hours: 6.5   Total Time spent with patient: 45 minutes Mental Status Per Nursing Assessment::   On Admission:  NA  Demographic Factors:  Male  Loss Factors: NA  Historical Factors: NA  Risk Reduction Factors:   NA  Continued Clinical Symptoms:  Alcohol/Substance Abuse/Dependencies  Cognitive Features That Contribute To Risk:  None    Suicide Risk:  Minimal: No identifiable suicidal ideation.  Patients presenting with no risk factors but with morbid ruminations; may be classified as minimal risk based on the severity of the depressive symptoms  Follow-up Information    [facility]. Go on 06/17/2018.   Specialty:  Behavioral Health Why:  Your hospital  follow up appointment is Friday, 06/17/18 at 8:00a. Please bring your photo ID, proof of insurance, social security card, and discharge paperwork.  Contact information: [address] [place] [address] [phone]           Plan Of Care/Follow-up recommendations:  Activity:  nl  Malvin JohnsFARAH,Lindsie Simar, MD 06/10/2018, 8:09 AM

## 2018-06-10 NOTE — Progress Notes (Signed)
Patient ID: Koren BoundDewayne Biancardi, male   DOB: May 05, 1996, 22 y.o.   MRN: [id number]  D: Pt alert and oriented on the unit.   A: Education, support, and encouragement provided. Discharge summary, medications and follow up appointments reviewed with pt. Suicide prevention resources provided, including "My 3 App." Pt's belongings in locker # 16 returned and belongings sheet signed.  R: Pt denies SI/HI, A/VH, pain, or any concerns at this time. Pt ambulatory on and off unit. Pt discharged to lobby.

## 2018-06-10 NOTE — Progress Notes (Signed)
Pt is in room isolating.  Pt is calm and friendly and sts he wants to be cooperative and does not believe he belongs here.Pt is still upset and being forced to take 3 shots in ED to calm him down. Pt is med compliant, denies SI, HI and AVH.  Pt denies pain or discomfort.

## 2018-06-10 NOTE — Tx Team (Signed)
Interdisciplinary Treatment and Diagnostic Plan Update  06/10/2018 Time of Session: 0830AM Koren BoundDewayne Spence MRN: [id number]  Principal Diagnosis: Schizoaffective psychosis  Secondary Diagnoses: Active Problems:   Schizoaffective psychosis (HCC)   Current Medications:  Current Facility-Administered Medications  Medication Dose Route Frequency Provider Last Rate Last Dose  . acetaminophen (TYLENOL) tablet 650 mg  650 mg Oral Q6H PRN Maryagnes AmosStarkes-Perry, Takia S, FNP   650 mg at 06/08/18 1748  . alum & mag hydroxide-simeth (MAALOX/MYLANTA) 200-200-20 MG/5ML suspension 30 mL  30 mL Oral Q4H PRN Rosario AdieStarkes-Perry, Juel Burrowakia S, FNP      . LORazepam (ATIVAN) tablet 2 mg  2 mg Oral Q4H PRN Malvin JohnsFarah, Brian, MD       Or  . LORazepam (ATIVAN) injection 2 mg  2 mg Intramuscular Q4H PRN Malvin JohnsFarah, Brian, MD      . magnesium hydroxide (MILK OF MAGNESIA) suspension 30 mL  30 mL Oral Daily PRN Rosario AdieStarkes-Perry, Juel Burrowakia S, FNP      . OLANZapine zydis (ZYPREXA) disintegrating tablet 10 mg  10 mg Oral QHS Malvin JohnsFarah, Brian, MD   10 mg at 06/09/18 2140  . OLANZapine zydis (ZYPREXA) disintegrating tablet 5 mg  5 mg Oral Daily PRN Maryagnes AmosStarkes-Perry, Takia S, FNP      . omega-3 acid ethyl esters (LOVAZA) capsule 1 g  1 g Oral BID Malvin JohnsFarah, Brian, MD      . prenatal multivitamin tablet 1 tablet  1 tablet Oral Q1200 Malvin JohnsFarah, Brian, MD      . ziprasidone (GEODON) injection 20 mg  20 mg Intramuscular Q6H PRN Malvin JohnsFarah, Brian, MD       PTA Medications: No medications prior to admission.    Patient Stressors: Marital or family conflict Occupational concerns  Patient Strengths: Ability for insight Average or above average intelligence General fund of knowledge  Treatment Modalities: Medication Management, Group therapy, Case management,  1 to 1 session with clinician, Psychoeducation, Recreational therapy.   Physician Treatment Plan for Primary Diagnosis:  Schizoaffective psychosis Long Term Goal(s): Improvement in symptoms so as ready for  discharge Improvement in symptoms so as ready for discharge   Short Term Goals: Ability to identify changes in lifestyle to reduce recurrence of condition will improve Ability to verbalize feelings will improve Compliance with prescribed medications will improve Ability to identify triggers associated with substance abuse/mental health issues will improve  Medication Management: Evaluate patient's response, side effects, and tolerance of medication regimen.  Therapeutic Interventions: 1 to 1 sessions, Unit Group sessions and Medication administration.  Evaluation of Outcomes: Adequate for Discharge  Physician Treatment Plan for Secondary Diagnosis: Active Problems:   Schizoaffective psychosis (HCC)  Long Term Goal(s): Improvement in symptoms so as ready for discharge Improvement in symptoms so as ready for discharge   Short Term Goals: Ability to identify changes in lifestyle to reduce recurrence of condition will improve Ability to verbalize feelings will improve Compliance with prescribed medications will improve Ability to identify triggers associated with substance abuse/mental health issues will improve     Medication Management: Evaluate patient's response, side effects, and tolerance of medication regimen.  Therapeutic Interventions: 1 to 1 sessions, Unit Group sessions and Medication administration.  Evaluation of Outcomes: Adequate for Discharge   RN Treatment Plan for Primary Diagnosis:  Schizoaffective psychosis Long Term Goal(s): Knowledge of disease and therapeutic regimen to maintain health will improve  Short Term Goals: Ability to remain free from injury will improve, Ability to verbalize frustration and anger appropriately will improve, Ability to demonstrate self-control and Ability to verbalize  feelings will improve  Medication Management: RN will administer medications as ordered by provider, will assess and evaluate patient's response and provide education to  patient for prescribed medication. RN will report any adverse and/or side effects to prescribing provider.  Therapeutic Interventions: 1 on 1 counseling sessions, Psychoeducation, Medication administration, Evaluate responses to treatment, Monitor vital signs and CBGs as ordered, Perform/monitor CIWA, COWS, AIMS and Fall Risk screenings as ordered, Perform wound care treatments as ordered.  Evaluation of Outcomes: Adequate for Discharge   LCSW Treatment Plan for Primary Diagnosis:  Schizoaffective psychosis Long Term Goal(s): Safe transition to appropriate next level of care at discharge, Engage patient in therapeutic group addressing interpersonal concerns.  Short Term Goals: Engage patient in aftercare planning with referrals and resources, Increase emotional regulation, Identify triggers associated with mental health/substance abuse issues and Increase skills for wellness and recovery  Therapeutic Interventions: Assess for all discharge needs, 1 to 1 time with Social worker, Explore available resources and support systems, Assess for adequacy in community support network, Educate family and significant other(s) on suicide prevention, Complete Psychosocial Assessment, Interpersonal group therapy.  Evaluation of Outcomes: Adequate for Discharge   Progress in Treatment: Attending groups: No. Participating in groups: No.Pt remained isolative in his room throughout stay.  Taking medication as prescribed: Yes. Toleration medication: Yes. Family/Significant other contact made: Yes, individual(s) contacted:  pt's mother contacted to complete SPE and for collateral information. Patient understands diagnosis: Yes. Discussing patient identified problems/goals with staff: Yes. Medical problems stabilized or resolved: Yes. Denies suicidal/homicidal ideation: Yes. Issues/concerns per patient self-inventory: No. Other: n/a   New problem(s) identified: No, Describe:  n/a  New Short Term/Long Term  Goal(s): detox, medication management for mood stabilization; elimination of SI thoughts; development of comprehensive mental wellness/sobriety plan.   Patient Goals:  "To go home. I'm not crazy. I don't belong here."   Discharge Plan or Barriers: Pt plans to return home to his apt. His mother will pick him up today. [facility] pamphlet, Mobile Crisis information, and AA/NA information provided to patient for additional community support and resources.   Reason for Continuation of Hospitalization: none  Estimated Length of Stay: today, 06/10/18  Attendees: Patient: Kenneth Spence 06/10/2018 9:17 AM  Physician: Dr. Jeannine KittenFarah MD 06/10/2018 9:17 AM  Nursing: Alexia FreestonePatty RN; Casimiro NeedleMichael RN 06/10/2018 9:17 AM  RN Care Manager:x 06/10/2018 9:17 AM  Social Worker: Corrie MckusickHeather Mischelle Reeg LCSW 06/10/2018 9:17 AM  Recreational Therapist: x 06/10/2018 9:17 AM  Other: Armandina StammerAgnes Nwoko NP; Marciano SequinJanet Sykes NP 06/10/2018 9:17 AM  Other:  06/10/2018 9:17 AM  Other: 06/10/2018 9:17 AM    Scribe for Treatment Team: Rona RavensHeather S Trai Ells, LCSW 06/10/2018 9:17 AM

## 2018-06-10 NOTE — Progress Notes (Signed)
  [facility] :  Will you be returning to the same living situation after discharge:  Yes,  home At discharge, do you have transportation home?: Yes,  mother Do you have the ability to pay for your medications: Yes,  mental health  Release of information consent forms completed and submitted to medical records by CSW.   Patient to Follow up at: Follow-up Information    [facility]. Go on 06/17/2018.   Specialty:  Behavioral Health Why:  Your hospital follow up appointment is Friday, 06/17/18 at 8:00a. Please bring your photo ID, proof of insurance, social security card, and discharge paperwork.  Contact information: [address] [place] [address] [phone]           Next level of care provider has access to [facility] Link:no  Safety Planning and Suicide Prevention discussed: Yes,  SPE completed with pt and his mother. SPI pamphlet and mobile crisis information provided to pt.   Have you used any form of tobacco in the last 30 days? (Cigarettes, Smokeless Tobacco, Cigars, and/or Pipes): No  Has patient been referred to the Quitline?: N/A patient is not a smoker  Patient has been referred for addiction treatment: Yes  Rona RavensHeather S Danyetta Gillham, LCSW 06/10/2018, 9:20 AM

## 2018-10-17 ENCOUNTER — Encounter (HOSPITAL_COMMUNITY): Payer: Self-pay | Admitting: Psychiatry

## 2018-10-17 ENCOUNTER — Other Ambulatory Visit: Payer: Self-pay

## 2018-10-17 ENCOUNTER — Ambulatory Visit (INDEPENDENT_AMBULATORY_CARE_PROVIDER_SITE_OTHER): Payer: Self-pay | Admitting: Psychiatry

## 2018-10-17 DIAGNOSIS — F4310 Post-traumatic stress disorder, unspecified: Secondary | ICD-10-CM

## 2018-10-17 DIAGNOSIS — F250 Schizoaffective disorder, bipolar type: Secondary | ICD-10-CM

## 2018-10-17 DIAGNOSIS — F1210 Cannabis abuse, uncomplicated: Secondary | ICD-10-CM

## 2018-10-17 DIAGNOSIS — F419 Anxiety disorder, unspecified: Secondary | ICD-10-CM

## 2018-10-17 MED ORDER — OLANZAPINE 5 MG PO TABS: 5.0000 mg | ORAL_TABLET | Freq: Every day | ORAL | 1 refills | Status: DC

## 2018-10-17 MED ORDER — HYDROXYZINE PAMOATE 25 MG PO CAPS: 25.0000 mg | ORAL_CAPSULE | Freq: Two times a day (BID) | ORAL | 0 refills | Status: DC | PRN
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
## 2018-11-16 ENCOUNTER — Other Ambulatory Visit: Payer: Self-pay

## 2018-11-16 ENCOUNTER — Ambulatory Visit (INDEPENDENT_AMBULATORY_CARE_PROVIDER_SITE_OTHER): Payer: Self-pay | Admitting: Psychiatry

## 2018-11-16 ENCOUNTER — Encounter (HOSPITAL_COMMUNITY): Payer: Self-pay | Admitting: Psychiatry

## 2018-11-16 DIAGNOSIS — F250 Schizoaffective disorder, bipolar type: Secondary | ICD-10-CM

## 2018-11-16 DIAGNOSIS — F419 Anxiety disorder, unspecified: Secondary | ICD-10-CM

## 2018-11-16 DIAGNOSIS — F1210 Cannabis abuse, uncomplicated: Secondary | ICD-10-CM

## 2018-11-16 DIAGNOSIS — F4310 Post-traumatic stress disorder, unspecified: Secondary | ICD-10-CM

## 2018-11-16 MED ORDER — OLANZAPINE 10 MG PO TABS: 10.0000 mg | ORAL_TABLET | Freq: Every day | ORAL | 1 refills | Status: AC

## 2018-11-16 MED ORDER — OLANZAPINE 10 MG PO TABS: 10.0000 mg | ORAL_TABLET | Freq: Every day | ORAL | 1 refills | Status: DC

## 2018-11-16 MED ORDER — HYDROXYZINE PAMOATE 25 MG PO CAPS: 25.0000 mg | ORAL_CAPSULE | Freq: Two times a day (BID) | ORAL | 0 refills | Status: DC | PRN

## 2018-11-16 NOTE — Progress Notes (Signed)
Virtual Visit via Telephone Note  I connected with Kenneth Spence on 11/16/18 at 11:00 AM EDT by telephone and verified that I am speaking with the correct person using two identifiers.   I discussed the limitations, risks, security and privacy concerns of performing an evaluation and management service by telephone and the availability of in person appointments. I also discussed with the patient that there may be a patient responsible charge related to this service. The patient expressed understanding and agreed to proceed.   History of Present Illness: Patient was evaluated on the phone.  He is a 23 year old African-American single man who had his first appointment on April 27.  Patient has history of schizophrenia, cannabis use, anxiety and he was admitted in the hospital December due to worsening of symptoms.  He was aggressive and wanted to kill himself.  Patient was discharged on olanzapine but noncompliant with medication and started to notice decompensation.  We started him on olanzapine 5 mg and hydroxyzine 25 mg to take twice a day as needed for anxiety.  He admitted improvement in his mood, irritability but is still struggle with paranoia, lack of sleep, and anxiety.  Though he is not irritable, emotional having crying spells and hallucination but is still does not leave the house unless his mother is with him.  He still smoke marijuana once a day however significantly cut down from the past.  He used to smoke marijuana 5-6 times a day.  He feels the medicine helping but reported dose may not be strong enough.  He try to take hydroxyzine 25 mg but sometimes he feel it works and sometimes he still feel the same.  He still endorse paranoia believes people looking at him and talking about him.  He used to work at [employer] but laid off recently due to COVID-19.  Patient admitted sometimes having nightmares and flashback and usually he struggles with sleep all night.  He reported only sleeping  1 to 2 hours.  He lives with his mother and stepfather.  He is getting along with them without any issues.  Denies drinking or using any cocaine but admitted smoking marijuana every day.  He admitted his appetite is better since taking the medication.  Past psychiatric history; History of one inpatient in December 2019 due to psychosis and bizarre behavior.  UDS positive for cannabis, benzodiazepine and amphetamine.  Discharged on olanzapine but never follow-up.  No history of suicidal attempt.  No history of legal issues.  Psychiatric Specialty Exam: Physical Exam  ROS  There were no vitals taken for this visit.There is no height or weight on file to calculate BMI.  General Appearance: NA  Eye Contact:  NA  Speech:  Slow  Volume:  Decreased  Mood:  Anxious and Dysphoric  Affect:  NA  Thought Process:  Descriptions of Associations: Intact  Orientation:  Full (Time, Place, and Person)  Thought Content:  Ideas of Reference:   Paranoia and Paranoid Ideation  Suicidal Thoughts:  No  Homicidal Thoughts:  No  Memory:  Immediate;   Good Recent;   Good Remote;   Good  Judgement:  Fair  Insight:  Fair  Psychomotor Activity:  NA  Concentration:  Concentration: Fair and Attention Span: Fair  Recall:  Good  Fund of Knowledge:  Good  Language:  Good  Akathisia:  NA  Handed:  Right  AIMS (if indicated):     Assets:  Communication Skills Desire for Improvement Housing Resilience  ADL's:  Intact  Cognition:  WNL  Sleep:   3 hours      Assessment and Plan: Schizoaffective disorder, bipolar type.  Cannabis use.  PTSD.  Generalized anxiety.  Patient doing better from the past but is still struggle with paranoia, delusions and anxiety.  He also struggled with insomnia and nightmares.  Recommended to try olanzapine 10 mg since he is tolerating well his medication.  Continue hydroxyzine 25 mg twice a day as needed for anxiety.  Encourage that he need to stop the cannabis so far he has cut  down his cannabis use from the past.  I also encourage consider therapy for coping skills and he agreed with the plan.  We will schedule appointment to see a therapist in our office.  Discussed medication side effects and benefits.  Patient reported no tremors, shakes.  He endorses appetite is better.  I recommend to call us back if is any question, concern if he feels worsening of the symptom.  Follow-up in 2 months.  Discuss safety concern that anytime having active suicidal thoughts or homicidal thoughts and he need to call 911 or go to local emergency room.  Follow Up Instructions:    I discussed the assessment and treatment plan with the patient. The patient was provided an opportunity to ask questions and all were answered. The patient agreed with the plan and demonstrated an understanding of the instructions.   The patient was advised to call back or seek an in-person evaluation if the symptoms worsen or if the condition fails to improve as anticipated.  I provided 30 minutes of non-face-to-face time during this encounter.   Cleotis NipperSyed T Asuncion Tapscott, MD

## 2018-12-15 ENCOUNTER — Other Ambulatory Visit (HOSPITAL_COMMUNITY): Payer: Self-pay | Admitting: Psychiatry

## 2018-12-15 DIAGNOSIS — F419 Anxiety disorder, unspecified: Secondary | ICD-10-CM

## 2018-12-15 DIAGNOSIS — F4310 Post-traumatic stress disorder, unspecified: Secondary | ICD-10-CM

## 2018-12-20 ENCOUNTER — Other Ambulatory Visit (HOSPITAL_COMMUNITY): Payer: Self-pay

## 2018-12-20 DIAGNOSIS — F4310 Post-traumatic stress disorder, unspecified: Secondary | ICD-10-CM

## 2018-12-20 DIAGNOSIS — F419 Anxiety disorder, unspecified: Secondary | ICD-10-CM

## 2018-12-20 MED ORDER — HYDROXYZINE PAMOATE 25 MG PO CAPS: 25.0000 mg | ORAL_CAPSULE | Freq: Two times a day (BID) | ORAL | 0 refills | Status: AC | PRN

## 2019-01-18 ENCOUNTER — Ambulatory Visit (HOSPITAL_COMMUNITY): Payer: Self-pay | Admitting: Psychiatry

## 2019-01-18 ENCOUNTER — Other Ambulatory Visit: Payer: Self-pay

## 2020-06-05 ENCOUNTER — Emergency Department (HOSPITAL_COMMUNITY): Payer: Self-pay

## 2020-06-05 ENCOUNTER — Encounter (HOSPITAL_COMMUNITY): Payer: Self-pay

## 2020-06-05 ENCOUNTER — Other Ambulatory Visit: Payer: Self-pay

## 2020-06-05 ENCOUNTER — Emergency Department (HOSPITAL_COMMUNITY)
Admission: EM | Admit: 2020-06-05 | Discharge: 2020-06-05 | Disposition: A | Discharge status: Home / Self Care | Payer: Self-pay | Attending: Emergency Medicine | Admitting: Emergency Medicine

## 2020-06-05 DIAGNOSIS — N50811 Right testicular pain: Secondary | ICD-10-CM | POA: Insufficient documentation

## 2020-06-05 DIAGNOSIS — Z8674 Personal history of sudden cardiac arrest: Secondary | ICD-10-CM | POA: Insufficient documentation

## 2020-06-05 DIAGNOSIS — R102 Pelvic and perineal pain: Secondary | ICD-10-CM | POA: Insufficient documentation

## 2020-06-05 LAB — URINALYSIS, ROUTINE W REFLEX MICROSCOPIC
Bilirubin Urine: NEGATIVE
Glucose, UA: NEGATIVE mg/dL
Hgb urine dipstick: NEGATIVE
Ketones, ur: NEGATIVE mg/dL
Leukocytes,Ua: NEGATIVE
Nitrite: NEGATIVE
Protein, ur: NEGATIVE mg/dL
Specific Gravity, Urine: 1.034 — ABNORMAL HIGH (ref 1.005–1.030)
pH: 5 (ref 5.0–8.0)

## 2020-06-05 MED ORDER — KETOROLAC TROMETHAMINE 15 MG/ML IJ SOLN
15.0000 mg | Freq: Once | INTRAMUSCULAR | Status: AC
  Administered 2020-06-05: 15 mg via INTRAVENOUS
  Filled 2020-06-05: qty 1

## 2020-06-05 MED ORDER — FENTANYL CITRATE (PF) 100 MCG/2ML IJ SOLN
50.0000 ug | Freq: Once | INTRAMUSCULAR | Status: AC
  Administered 2020-06-05: 50 ug via INTRAVENOUS
  Filled 2020-06-05: qty 2

## 2020-06-05 NOTE — ED Triage Notes (Signed)
Pt presents with c/o right testicle swelling and pain since yesterday. Pt reports an issue with this in the past when he was younger. Pt denies any discoloration.

## 2020-06-05 NOTE — ED Provider Notes (Signed)
[facility]-EMERGENCY DEPT Provider Note   CSN: [id number] Arrival date & time: 06/05/20  0941     History Chief Complaint  Patient presents with  . Groin Pain  . Groin Swelling    Kenneth Spence is a 24 y.o. male presenting for evaluation of R testicular pain and swelling.   Patient states his symptoms began when he woke up this morning.  He reports acute onset right testicular pain and swelling.  States it feels similar to when he had torsion in the past.  He took Tylenol for this without significant improvement of symptoms.  He denies fever, chills, nausea, vomiting, abdominal pain, dysuria, hematuria, abnormal bowel movements.  He is sexually active with one male partner who is symptom-free.  They do not always use condoms.  He denies penile discharge or concerns for STDs.  He states he has no other medical problems, takes no medications daily.  Additional history obtained from chart review.  Patient with a history of orchiopexy after testicular torsion in 2014. additional h/o schizoaffective disorder.   HPI     Past Medical History:  Diagnosis Date  . Cardiac arrest [facility]) 03/1997   Wed patient had chest pain while running in PE, to ED, dehydrated    Patient Active Problem List   Diagnosis Date Noted  . Schizoaffective psychosis (HCC) 06/08/2018    Past Surgical History:  Procedure Laterality Date  . ORCHIOPEXY Bilateral 08/28/2012   Procedure: TESTICULAR TORSION REPAIR;  Surgeon: Judie Petit. Leonia Corona, MD;  Location: [facility] OR;  Service: Pediatrics;  Laterality: Bilateral;       Family History  Problem Relation Age of Onset  . Bipolar disorder Maternal Grandmother     Social History   Tobacco Use  . Smoking status: Never Smoker  . Smokeless tobacco: Never Used  Substance Use Topics  . Alcohol use: No  . Drug use: Yes    Types: Marijuana    Home Medications Prior to Admission medications   Medication Sig Start Date End Date Taking?  Authorizing Provider  hydrOXYzine (VISTARIL) 25 MG capsule Take 1 capsule (25 mg total) by mouth 2 (two) times daily as needed for anxiety. 12/20/18   Arfeen, Phillips Grout, MD  OLANZapine (ZYPREXA) 10 MG tablet Take 1 tablet (10 mg total) by mouth at bedtime. 11/16/18 01/15/19  Arfeen, Phillips Grout, MD  omega-3 acid ethyl esters (LOVAZA) 1 g capsule Take 1 capsule (1 g total) by mouth 2 (two) times daily. 06/10/18   Malvin Johns, MD    Allergies    Penicillins  Review of Systems   Review of Systems  Genitourinary: Positive for scrotal swelling and testicular pain.  All other systems reviewed and are negative.   Physical Exam Updated Vital Signs BP 125/80   Pulse (!) 58   Temp 98.6 F (37 C) (Oral)   Resp 16   SpO2 100%   Physical Exam Vitals and nursing note reviewed. Exam conducted with a chaperone present.  Constitutional:      General: He is not in acute distress.    Appearance: He is well-developed and well-nourished.     Comments: Appears nontoxic  HENT:     Head: Normocephalic and atraumatic.  Eyes:     Extraocular Movements: EOM normal.     Conjunctiva/sclera: Conjunctivae normal.     Pupils: Pupils are equal, round, and reactive to light.  Cardiovascular:     Rate and Rhythm: Normal rate and regular rhythm.     Pulses: Normal pulses  and intact distal pulses.  Pulmonary:     Effort: Pulmonary effort is normal. No respiratory distress.     Breath sounds: Normal breath sounds. No wheezing.  Abdominal:     General: There is no distension.     Palpations: Abdomen is soft.     Tenderness: There is no abdominal tenderness. There is no guarding or rebound.     Hernia: There is no hernia in the left inguinal area or right inguinal area.     Comments: No TTP of the abdomen.  No rigidity, guarding, distention.  Genitourinary:    Penis: Circumcised.      Testes: Normal.     Epididymis:     Right: Normal.     Left: Normal.     Comments: Hailey, NT, present as chaperone.  Patient  without obvious testicular swelling of the R, if anything ?slightly swollen L testicle.  No pain along the epididymis.  No obvious lesions.  No obvious penile discharge.  No inguinal lymphadenopathy or tenderness. Musculoskeletal:        General: Normal range of motion.     Cervical back: Normal range of motion and neck supple.  Lymphadenopathy:     Lower Body: No right inguinal adenopathy. No left inguinal adenopathy.  Skin:    General: Skin is warm and dry.  Neurological:     Mental Status: He is alert and oriented to person, place, and time.  Psychiatric:        Mood and Affect: Mood and affect normal.     ED Results / Procedures / Treatments   Labs (all labs ordered are listed, but only abnormal results are displayed) Labs Reviewed  URINALYSIS, ROUTINE W REFLEX MICROSCOPIC - Abnormal; Notable for the following components:      Result Value   Specific Gravity, Urine 1.034 (*)    All other components within normal limits  GC/CHLAMYDIA PROBE AMP ([place]) NOT AT [facility]    EKG None  Radiology US SCROTUM W/DOPPLER  Result Date: 06/05/2020 CLINICAL DATA:  Scrotal pain on the right. Previous right testicular torsion with orchiopexy procedures EXAM: SCROTAL ULTRASOUND DOPPLER ULTRASOUND OF THE TESTICLES TECHNIQUE: Complete ultrasound examination of the testicles, epididymis, and other scrotal structures was performed. Color and spectral Doppler ultrasound were also utilized to evaluate blood flow to the testicles. COMPARISON:  August 28, 2012 FINDINGS: Right testicle Measurements: 4.7 x 0.9 x 2.7 cm. No mass or microlithiasis visualized. Left testicle Measurements: 4.9 x 2.5 x 3.7 cm. No mass or microlithiasis visualized. Right epididymis: Normal in size and appearance. No edema or hyperemia. Left epididymis: There is a cyst in the head of the epididymis on the left measuring 0.4 x 0.3 x 0.5 cm. No other extratesticular mass. No hyperemia or edema. Hydrocele:  None visualized.  Varicocele: Small varicocele on the left with Valsalva maneuver. No varicocele on the right. Pulsed Doppler interrogation of both testes demonstrates normal low resistance arterial and venous waveforms bilaterally. No scrotal wall thickening or scrotal abscess on either side. IMPRESSION: 1. Low resistance waveform in each testis. No orchitis or torsion involving either testis. 2. 0.4 x 0.3 x 0.5 cm cyst in the head of the epididymis on the left. Epididymal structures otherwise appear normal bilaterally. 3.  No appreciable hydrocele on either side. 4.  Small varicocele on the left. Electronically Signed   By: Bretta Bang III M.D.   On: 06/05/2020 11:23    Procedures Procedures (including critical care time)  Medications Ordered in ED  Medications  fentaNYL (SUBLIMAZE) injection 50 mcg (50 mcg Intravenous Given 06/05/20 1124)  ketorolac (TORADOL) 15 MG/ML injection 15 mg (15 mg Intravenous Given 06/05/20 1409)    ED Course  I have reviewed the triage vital signs and the nursing notes.  Pertinent labs & imaging results that were available during my care of the patient were reviewed by me and considered in my medical decision making (see chart for details).    MDM Rules/Calculators/A&P                          Patient presenting for evaluation of right testicular pain and swelling.  On exam, patient appears nontoxic.  He has no abdominal tenderness.  GU exam is overall reassuring.  No obvious swelling or tenderness on my exam.  However, as patient had acute onset testicular pain, will obtain ultrasound.  Urine and GC/CL ordered.  Ultrasound negative for acute findings including obvious swelling, epididymitis, orchitis, or torsion.  Urine negative for infection.  No obvious blood.  Gonorrhea and Chlamydia culture sent.  On reassessment, patient reports mild improvement of pain, although he still has discomfort.  Discussed overall reassuring results.  Discussed follow-up with urology as  needed for continued evaluation of pain and swelling.  At this time, patient appears safe for discharge.  Return precautions given.  Patient states he understands and agrees to plan.  Final Clinical Impression(s) / ED Diagnoses Final diagnoses:  Pain in right testicle    Rx / DC Orders ED Discharge Orders    None       Alveria Apley, PA-C 06/05/20 1442    Gwyneth Sprout, MD 06/06/20 [phone]

## 2020-06-05 NOTE — ED Notes (Signed)
Patient given urinal for urine sample

## 2020-06-05 NOTE — Discharge Instructions (Addendum)
Take ibuprofen 3 times a day with meals as needed for pain.  Do not take other anti-inflammatories at the same time (Advil, Motrin, naproxen, Aleve). You may supplement with Tylenol if you need further pain control. Make sure you stay well-hydrated.  Your urine should be clear to pale yellow. Follow-up with the urologist listed below as needed if you continue to have pain. Return to the emergency room if you develop high fevers, persistent vomiting, severe worsening pain, inability to urinate, or any new, worsening, or concerning symptoms.

## 2022-01-03 ENCOUNTER — Encounter (HOSPITAL_BASED_OUTPATIENT_CLINIC_OR_DEPARTMENT_OTHER): Payer: Self-pay | Admitting: Emergency Medicine

## 2022-01-03 ENCOUNTER — Other Ambulatory Visit: Payer: Self-pay

## 2022-01-03 ENCOUNTER — Emergency Department (HOSPITAL_BASED_OUTPATIENT_CLINIC_OR_DEPARTMENT_OTHER)
Admission: EM | Admit: 2022-01-03 | Discharge: 2022-01-03 | Disposition: A | Discharge status: Home / Self Care | Payer: BC Managed Care – PPO | Attending: Emergency Medicine | Admitting: Emergency Medicine

## 2022-01-03 DIAGNOSIS — N4889 Other specified disorders of penis: Secondary | ICD-10-CM | POA: Insufficient documentation

## 2022-01-03 DIAGNOSIS — N489 Disorder of penis, unspecified: Secondary | ICD-10-CM

## 2022-01-03 LAB — URINALYSIS, ROUTINE W REFLEX MICROSCOPIC
Bilirubin Urine: NEGATIVE
Glucose, UA: NEGATIVE mg/dL
Ketones, ur: NEGATIVE mg/dL
Leukocytes,Ua: NEGATIVE
Nitrite: NEGATIVE
Protein, ur: NEGATIVE mg/dL
Specific Gravity, Urine: 1.02 (ref 1.005–1.030)
pH: 7 (ref 5.0–8.0)

## 2022-01-03 LAB — URINALYSIS, MICROSCOPIC (REFLEX)

## 2022-01-03 NOTE — Discharge Instructions (Signed)
There is a lesion on the bottom of your penis.  It looks more as if there was a previous injury that is healing, however STD testing has been done.  You will be notified if it is positive but we are not empirically treating for infection at this time.

## 2022-01-03 NOTE — ED Provider Notes (Signed)
[facility] EMERGENCY DEPARTMENT Provider Note   CSN: [id number] Arrival date & time: 01/03/22  1401     History  Chief Complaint  Patient presents with   SEXUALLY TRANSMITTED DISEASE    Kenneth Spence is a 26 y.o. male.  HPI Patient presents with possible penile lesion.  States he noticed it yesterday but became more apparent today.  Is on the ventral part of the penis.  Towards the base.  It is a hypopigmented area with central circular area that is approximately 1 mm across.  No induration.  No tenderness.  He states he thinks it could have come from shaving.  No penile discharge.  No fevers.  Does have risks for STDs however.   Past Medical History:  Diagnosis Date   Cardiac arrest [facility]) 03/1997   Wed patient had chest pain while running in PE, to ED, dehydrated    Home Medications Prior to Admission medications   Medication Sig Start Date End Date Taking? Authorizing Provider  hydrOXYzine (VISTARIL) 25 MG capsule Take 1 capsule (25 mg total) by mouth 2 (two) times daily as needed for anxiety. 12/20/18   Arfeen, Phillips Grout, MD  OLANZapine (ZYPREXA) 10 MG tablet Take 1 tablet (10 mg total) by mouth at bedtime. 11/16/18 01/15/19  Arfeen, Phillips Grout, MD  omega-3 acid ethyl esters (LOVAZA) 1 g capsule Take 1 capsule (1 g total) by mouth 2 (two) times daily. 06/10/18   Malvin Johns, MD      Allergies    Penicillins    Review of Systems   Review of Systems  Physical Exam Updated Vital Signs BP 117/75 (BP Location: Right Arm)   Pulse 81   Temp 99 F (37.2 C) (Oral)   Resp 18   Ht 5\' 11"  (1.803 m)   Wt 83.9 kg   SpO2 96%   BMI 25.80 kg/m  Physical Exam Genitourinary:    Comments: Small lesion on underside of penis near base.  Approximately 4 mm across.  No induration, hypopigmented.  1 mm more central area slightly darker.    ED Results / Procedures / Treatments   Labs (all labs ordered are listed, but only abnormal results are displayed) Labs Reviewed   URINALYSIS, ROUTINE W REFLEX MICROSCOPIC - Abnormal; Notable for the following components:      Result Value   Hgb urine dipstick TRACE (*)    All other components within normal limits  URINALYSIS, MICROSCOPIC (REFLEX) - Abnormal; Notable for the following components:   Bacteria, UA FEW (*)    All other components within normal limits  RPR  HIV ANTIBODY (ROUTINE TESTING W REFLEX)  GC/CHLAMYDIA PROBE AMP () NOT AT [facility]    EKG None  Radiology No results found.  Procedures Procedures    Medications Ordered in ED Medications - No data to display  ED Course/ Medical Decision Making/ A&P                           Medical Decision Making Amount and/or Complexity of Data Reviewed Labs: ordered.   Patient with penile lesion.  On the dorsum.  No penile discharge.  No mass.  More it is hypopigmented.  We will check for syphilis.  Gonorrhea and Chlamydia testing reportedly also done.  Although it has more the appearance of a hypopigmented area from previous wound potentially from shaving.  Low suspicion overall for STD.  I think reasonable to not treat empirically and await on  results.  Discharge        Final Clinical Impression(s) / ED Diagnoses Final diagnoses:  Penile lesion    Rx / DC Orders ED Discharge Orders     None         Benjiman Core, MD 01/03/22 1641

## 2022-01-03 NOTE — ED Notes (Signed)
ED Provider at bedside. 

## 2022-01-03 NOTE — ED Triage Notes (Signed)
Pt arrives pov, steady gait with c/o dysuria and redness in groin area x 2 days. Denies penile d/c

## 2022-01-04 LAB — HIV ANTIBODY (ROUTINE TESTING W REFLEX): HIV Screen 4th Generation wRfx: REACTIVE — AB

## 2022-01-04 LAB — RPR: RPR Ser Ql: NONREACTIVE

## 2022-01-05 LAB — GC/CHLAMYDIA PROBE AMP (~~LOC~~) NOT AT ARMC
Chlamydia: NEGATIVE
Comment: NEGATIVE
Comment: NORMAL
Neisseria Gonorrhea: NEGATIVE

## 2022-01-06 LAB — HIV-1/HIV-2 QUALITATIVE RNA
Final Interpretation: NEGATIVE
HIV-1 RNA, Qualitative: NONREACTIVE
HIV-2 RNA, Qualitative: NONREACTIVE

## 2022-01-06 LAB — HIV-1/2 AB - DIFFERENTIATION
HIV 1 Ab: NONREACTIVE
HIV 2 Ab: NONREACTIVE
Note: NEGATIVE

## 2022-01-07 ENCOUNTER — Telehealth: Payer: BC Managed Care – PPO | Admitting: Physician Assistant

## 2022-01-07 DIAGNOSIS — R768 Other specified abnormal immunological findings in serum: Secondary | ICD-10-CM | POA: Diagnosis not present

## 2022-01-07 NOTE — Progress Notes (Signed)
Virtual Visit Consent   Kenneth Spence, you are scheduled for a virtual visit with a [facility] provider today. Just as with appointments in the office, your consent must be obtained to participate. Your consent will be active for this visit and any virtual visit you may have with one of our providers in the next 365 days. If you have a MyChart account, a copy of this consent can be sent to you electronically.  As this is a virtual visit, video technology does not allow for your provider to perform a traditional examination. This may limit your provider's ability to fully assess your condition. If your provider identifies any concerns that need to be evaluated in person or the need to arrange testing (such as labs, EKG, etc.), we will make arrangements to do so. Although advances in technology are sophisticated, we cannot ensure that it will always work on either your end or our end. If the connection with a video visit is poor, the visit may have to be switched to a telephone visit. With either a video or telephone visit, we are not always able to ensure that we have a secure connection.  By engaging in this virtual visit, you consent to the provision of healthcare and authorize for your insurance to be billed (if applicable) for the services provided during this visit. Depending on your insurance coverage, you may receive a charge related to this service.  I need to obtain your verbal consent now. Are you willing to proceed with your visit today? Kenneth Spence has provided verbal consent on 01/07/2022 for a virtual visit (video or telephone). Kenneth Spence, [place]  Date: 01/07/2022 12:10 PM  Virtual Visit via Video Note   I, Kenneth Spence, connected with  Kenneth Spence  ([id number], 26-Jun-1995) on 01/07/22 at 11:45 AM EDT by a video-enabled telemedicine application and verified that I am speaking with the correct person using two identifiers.  Location: Patient: Virtual Visit  Location Patient: Home Provider: Virtual Visit Location Provider: Home Office   I discussed the limitations of evaluation and management by telemedicine and the availability of in person appointments. The patient expressed understanding and agreed to proceed.    History of Present Illness: Kenneth Spence is a 26 y.o. who identifies as a male who was assigned male at birth, and is being seen today for discussion of recent ER test results. Patient seen in ER on 7/15 with concerns of a penile lesion. Provider felt lesion was some hypopigmentation from recent shaving in the area. Did STI panel just to be cautious. UA unremarkable. Negative RPR and GC/Chlamydia screening. HIV Ab came back reactive but subsequent tests checked, including HIV1/2 AB and HIV1/2 RNA all negative. Patient has not heard from the ER staff about results and is highly concerned about HIV. Is sexually active with one monogamous male partner, recently having a child together. No history of MSM or other high-risk sexual behavior.   HPI: HPI  Problems:  Patient Active Problem List   Diagnosis Date Noted   Schizoaffective psychosis (HCC) 06/08/2018    Allergies:  Allergies  Allergen Reactions   Penicillins Shortness Of Breath, Nausea And Vomiting and Rash    DID THE REACTION INVOLVE: Swelling of the face/tongue/throat, SOB, or low BP? UNK Sudden or severe rash/hives, skin peeling, or the inside of the mouth or nose? UNK Did it require medical treatment? UNK When did it last happen?      UNK If all above answers are "NO", may proceed  with cephalosporin use.    Medications:  Current Outpatient Medications:    hydrOXYzine (VISTARIL) 25 MG capsule, Take 1 capsule (25 mg total) by mouth 2 (two) times daily as needed for anxiety., Disp: 45 capsule, Rfl: 0   OLANZapine (ZYPREXA) 10 MG tablet, Take 1 tablet (10 mg total) by mouth at bedtime., Disp: 30 tablet, Rfl: 1   omega-3 acid ethyl esters (LOVAZA) 1 g capsule, Take 1  capsule (1 g total) by mouth 2 (two) times daily., Disp: 60 capsule, Rfl: 5  Observations/Objective: Patient is well-developed, well-nourished in no acute distress.  Resting comfortably at home.  Head is normocephalic, atraumatic.  No labored breathing. Speech is clear and coherent with logical content.  Patient is alert and oriented at baseline.   Assessment and Plan: 1. False-positive serological test result Reassurance given that he is low risk for contracting HIV as he is monogamous with 1 long-term male partner who has just given birth recently to their child and tested negative during screenings while pregnant, he has no other risks for HIV and subsequent HIV antibody and RNA testing are all negative. Discussed even though uncommon, false positives do occur. In absence of risk factors, no reason to repeat HIV RNA testing in 1-2 weeks. Would consider repeat HIV Ab in 3 months if any further concerns.    Follow Up Instructions: I discussed the assessment and treatment plan with the patient. The patient was provided an opportunity to ask questions and all were answered. The patient agreed with the plan and demonstrated an understanding of the instructions.  A copy of instructions were sent to the patient via MyChart unless otherwise noted below.   The patient was advised to call back or seek an in-person evaluation if the symptoms worsen or if the condition fails to improve as anticipated.  Time:  I spent 10 minutes with the patient via telehealth technology discussing the above problems/concerns.    Kenneth Climes, PA-C

## 2022-01-07 NOTE — Patient Instructions (Signed)
  [name], thank you for joining Piedad Climes, PA-C for today's virtual visit.  While this provider is not your primary care provider (PCP), if your PCP is located in our provider database this encounter information will be shared with them immediately following your visit.  Consent: (Patient) [name] provided verbal consent for this virtual visit at the beginning of the encounter.  Current Medications:  Current Outpatient Medications:    hydrOXYzine (VISTARIL) 25 MG capsule, Take 1 capsule (25 mg total) by mouth 2 (two) times daily as needed for anxiety., Disp: 45 capsule, Rfl: 0   OLANZapine (ZYPREXA) 10 MG tablet, Take 1 tablet (10 mg total) by mouth at bedtime., Disp: 30 tablet, Rfl: 1   omega-3 acid ethyl esters (LOVAZA) 1 g capsule, Take 1 capsule (1 g total) by mouth 2 (two) times daily., Disp: 60 capsule, Rfl: 5   Medications ordered in this encounter:  No orders of the defined types were placed in this encounter.    *If you need refills on other medications prior to your next appointment, please contact your pharmacy*  Follow-Up: Call back or seek an in-person evaluation if the symptoms worsen or if the condition fails to improve as anticipated.  Other Instructions  No sign of HIV, which is great news! Just a false positive initial test but all the follow-up tests are negative. The ER will likely reach out to you as well with your results once the ordering provider has reviewed them as well.     If you have been instructed to have an in-person evaluation today at a local Urgent Care facility, please use the link below. It will take you to a list of all of our available [facility], including address, phone number and hours of operation. Please do not delay care.  [facility]  If you or a family member do not have a primary care provider, use the link below to schedule a visit and establish care. When you choose a [facility]  primary care physician or advanced practice provider, you gain a long-term partner in health. Find a Primary Care Provider  Learn more about [facility]'s in-office and virtual care options: [facility] - Get Care Now

## 2022-07-02 IMAGING — US US SCROTUM W/ DOPPLER COMPLETE
1 series · 13 of 25 positions shown · non-contrast
Comparison: August 28, 2012

CLINICAL DATA: Scrotal pain on the right. Previous right testicular
torsion with orchiopexy procedures

EXAM:
SCROTAL ULTRASOUND
DOPPLER ULTRASOUND OF THE TESTICLES
TECHNIQUE: Complete ultrasound examination of the testicles, epididymis, and
other scrotal structures was performed. Color and spectral Doppler
ultrasound were also utilized to evaluate blood flow to the
testicles.

[Series 1: us scrotum w/ doppler complete · 13 of 54 slices shown]
[im 1/54]
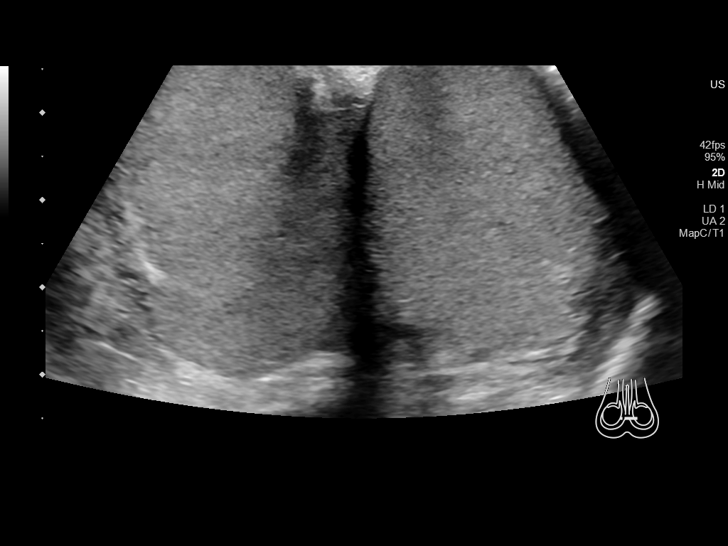
[im 5/54]
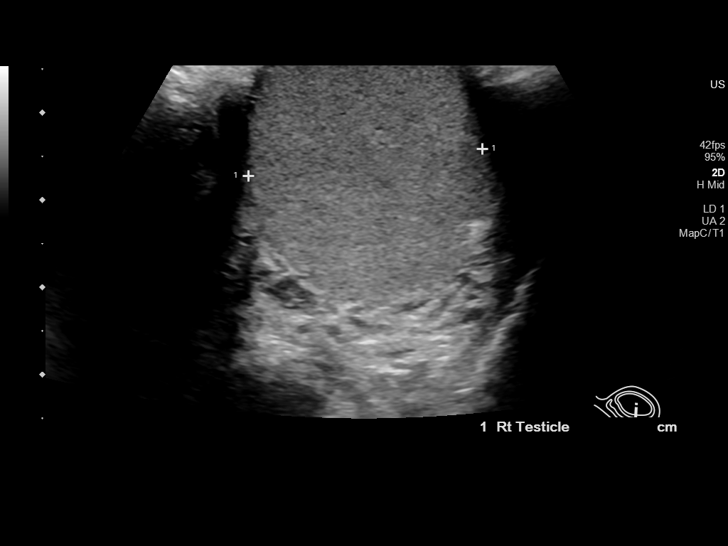
[im 9/54]
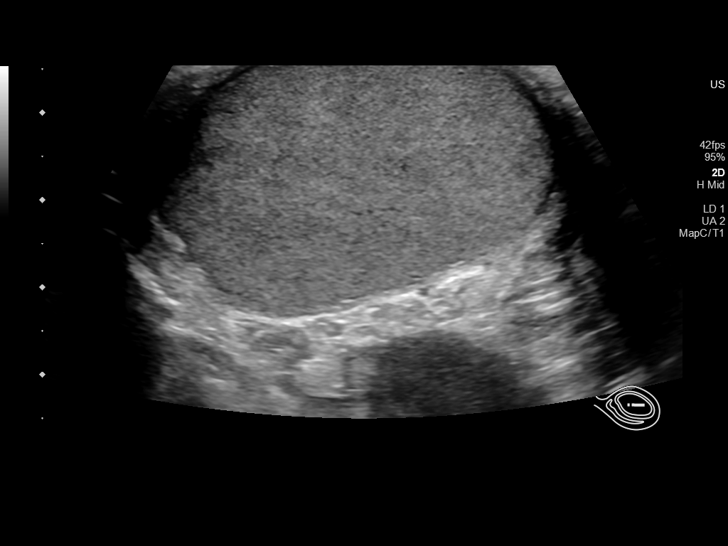
[im 14/54]
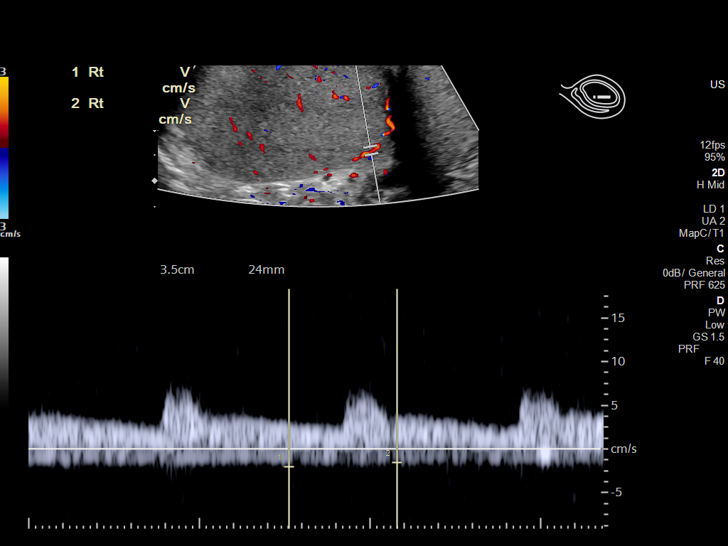
[im 18/54]
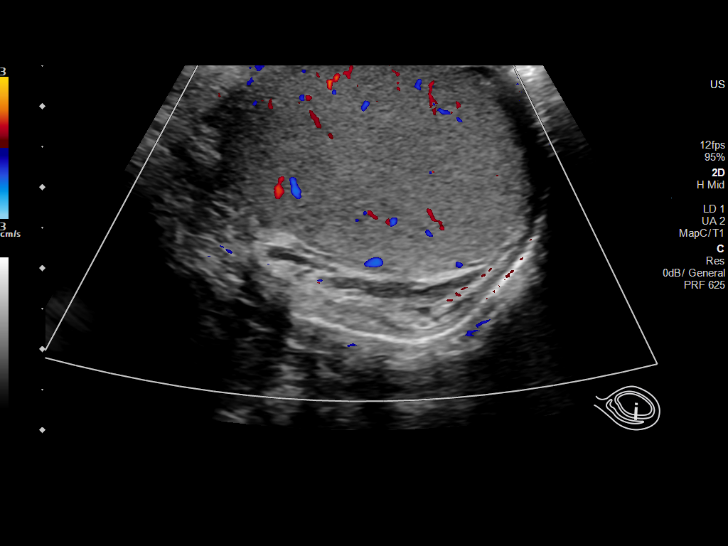
[im 23/54]
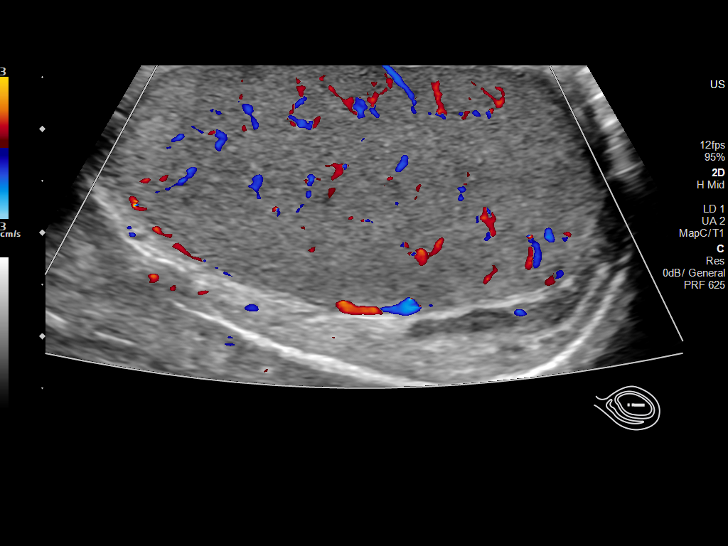
[im 27/54]
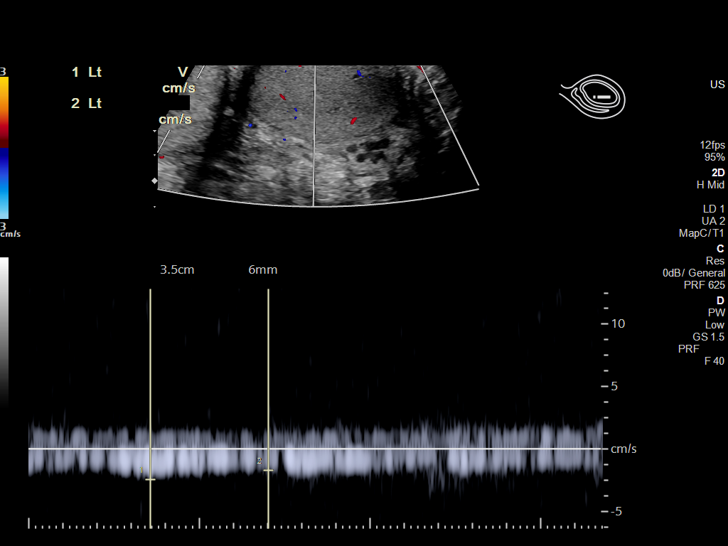
[im 31/54]
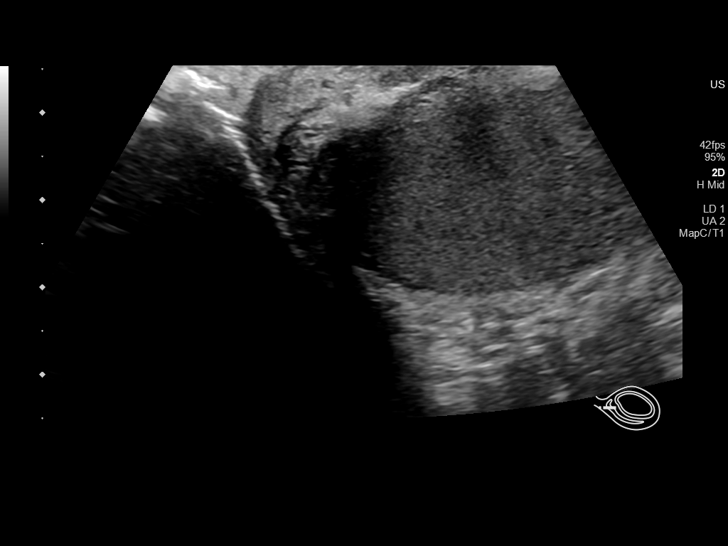
[im 36/54]
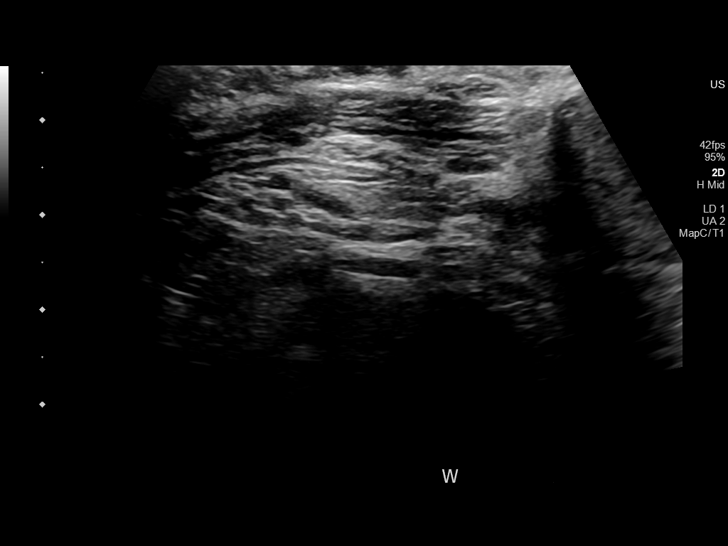
[im 40/54]
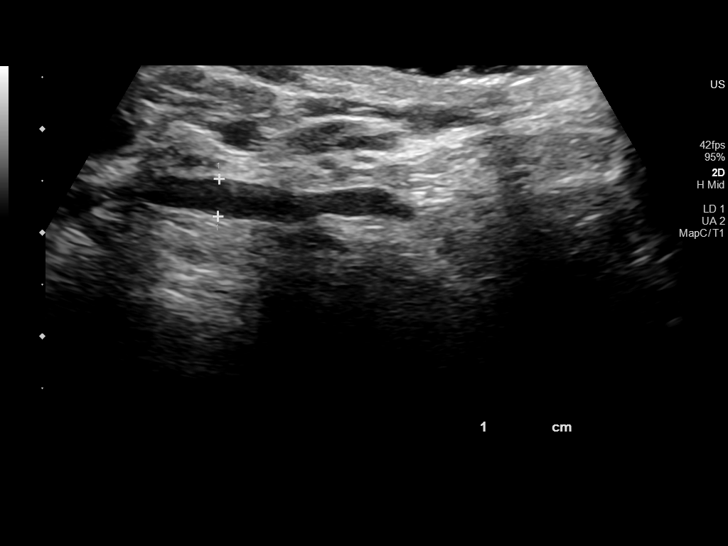
[im 45/54]
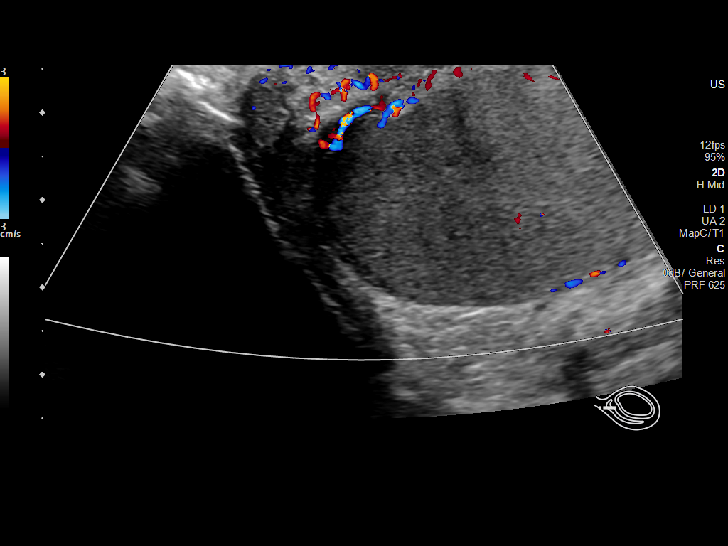
[im 49/54]
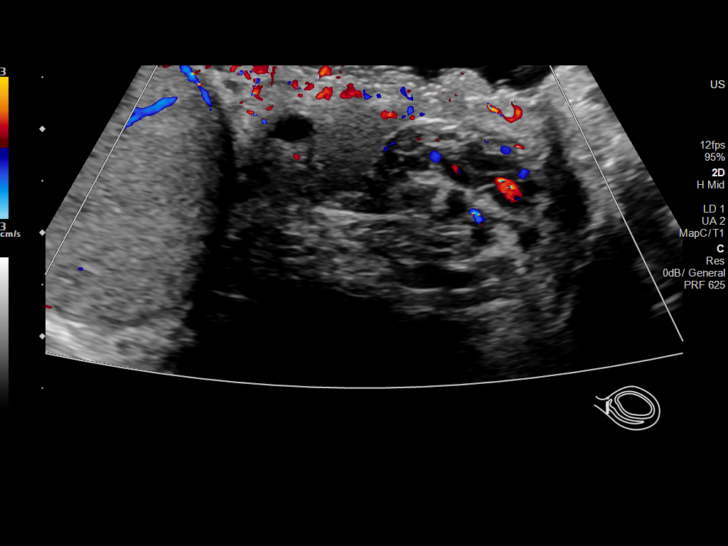
[im 54/54]
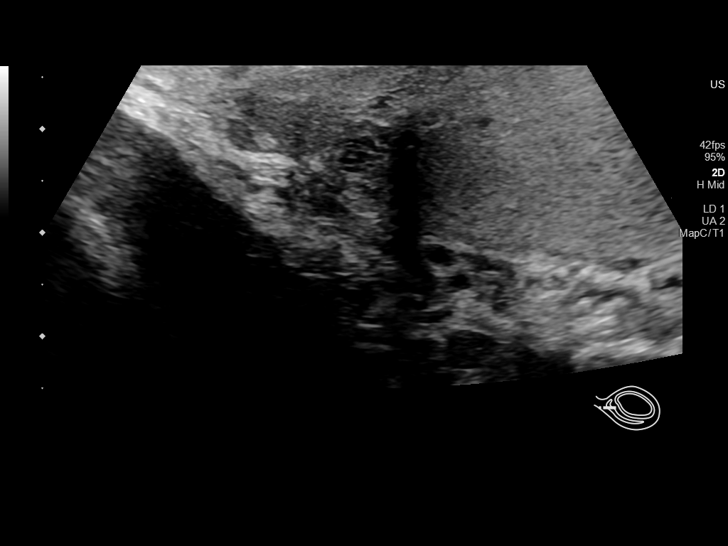

[13 of 25 positions shown; findings below may reference images not displayed]

FINDINGS: Right testicle

Measurements: 4.7 x 0.9 x 2.7 cm. No mass or microlithiasis
visualized.

Left testicle

Measurements: 4.9 x 2.5 x 3.7 cm. No mass or microlithiasis
visualized.

Right epididymis: Normal in size and appearance. No edema or
hyperemia.

Left epididymis: There is a cyst in the head of the epididymis on
the left measuring 0.4 x 0.3 x 0.5 cm. No other extratesticular
mass. No hyperemia or edema.

Hydrocele:  None visualized.

Varicocele: Small varicocele on the left with Valsalva maneuver. No
varicocele on the right.

Pulsed Doppler interrogation of both testes demonstrates normal low
resistance arterial and venous waveforms bilaterally. No scrotal
wall thickening or scrotal abscess on either side.
IMPRESSION: 1. Low resistance waveform in each testis. No orchitis or torsion
involving either testis.

2. 0.4 x 0.3 x 0.5 cm cyst in the head of the epididymis on the
left. Epididymal structures otherwise appear normal bilaterally.

3.  No appreciable hydrocele on either side.

4.  Small varicocele on the left.

## 2022-08-28 ENCOUNTER — Ambulatory Visit
Admission: RE | Admit: 2022-08-28 | Discharge: 2022-08-28 | Disposition: A | Discharge status: Home / Self Care | Payer: BC Managed Care – PPO | Source: Ambulatory Visit | Attending: Physician Assistant | Admitting: Physician Assistant

## 2022-08-28 ENCOUNTER — Telehealth: Payer: BC Managed Care – PPO

## 2022-08-28 VITALS — BP 111/66 | HR 74 | Temp 98.0°F | Resp 12

## 2022-08-28 DIAGNOSIS — Z113 Encounter for screening for infections with a predominantly sexual mode of transmission: Secondary | ICD-10-CM | POA: Diagnosis present

## 2022-08-28 NOTE — Discharge Instructions (Addendum)
Lab testing will be completed in 48 hours.  If you do not get a call from this office that indicates the test are negative.  Log onto MyChart to be the test results with the post in 48 hours  Advised follow-up PCP return to urgent care as needed.

## 2022-08-28 NOTE — ED Provider Notes (Signed)
[facility]    CSN: [id number] Arrival date & time: 08/28/22  1011      History   Chief Complaint Chief Complaint  Patient presents with   Exposure to STD    Entered by patient    HPI Kenneth Spence is a 27 y.o. male.   -year-old male presents for STI screening.  Patient indicates he did desires to have STI screening to include HIV and RPR testing.  Patient indicates that he is not having any symptoms but wants to be careful and desires to be checked.  He indicates that his last sexual contact was 3 weeks ago and he used protection-condom.  Patient indicates he also had sexual contact a month ago and did not use any protection.  He also desires to be set up with a family practice provider for ongoing care.   Exposure to STD    Past Medical History:  Diagnosis Date   Cardiac arrest [facility]) 03/1997   Wed patient had chest pain while running in PE, to ED, dehydrated    Patient Active Problem List   Diagnosis Date Noted   Schizoaffective psychosis ([facility]) 06/08/2018    Past Surgical History:  Procedure Laterality Date   ORCHIOPEXY Bilateral 08/28/2012   Procedure: TESTICULAR TORSION REPAIR;  Surgeon: Jerilynn Mages. Gerald Stabs, MD;  Location: [facility];  Service: Pediatrics;  Laterality: Bilateral;       Home Medications    Prior to Admission medications   Medication Sig Start Date End Date Taking? Authorizing Provider  hydrOXYzine (VISTARIL) 25 MG capsule Take 1 capsule (25 mg total) by mouth 2 (two) times daily as needed for anxiety. 12/20/18   Arfeen, Arlyce Harman, MD  OLANZapine (ZYPREXA) 10 MG tablet Take 1 tablet (10 mg total) by mouth at bedtime. 11/16/18 01/15/19  Arfeen, Arlyce Harman, MD  omega-3 acid ethyl esters (LOVAZA) 1 g capsule Take 1 capsule (1 g total) by mouth 2 (two) times daily. 06/10/18   Johnn Hai, MD    Family History Family History  Problem Relation Age of Onset   Bipolar disorder Maternal Grandmother     Social History Social History   Tobacco Use    Smoking status: Never   Smokeless tobacco: Never  Substance Use Topics   Alcohol use: Not Currently   Drug use: Yes    Types: Marijuana     Allergies   Penicillins   Review of Systems Review of Systems   Physical Exam Triage Vital Signs ED Triage Vitals  Enc Vitals Group     BP 08/28/22 1049 111/66     Pulse Rate 08/28/22 1049 74     Resp 08/28/22 1049 12     Temp 08/28/22 1049 98 F (36.7 C)     Temp Source 08/28/22 1049 Oral     SpO2 08/28/22 1049 96 %     Weight --      Height --      Head Circumference --      Peak Flow --      Pain Score 08/28/22 1048 0     Pain Loc --      Pain Edu? --      Excl. in [place]? --    No data found.  Updated Vital Signs BP 111/66 (BP Location: Right Arm)   Pulse 74   Temp 98 F (36.7 C) (Oral)   Resp 12   SpO2 96%   Visual Acuity Right Eye Distance:   Left Eye Distance:   Bilateral  Distance:    Right Eye Near:   Left Eye Near:    Bilateral Near:     Physical Exam Constitutional:      Appearance: Normal appearance.  Neurological:     Mental Status: He is alert.      UC Treatments / Results  Labs (all labs ordered are listed, but only abnormal results are displayed) Labs Reviewed  HIV ANTIBODY (ROUTINE TESTING W REFLEX)  RPR  CYTOLOGY, (ORAL, ANAL, URETHRAL) ANCILLARY ONLY    EKG   Radiology No results found.  Procedures Procedures (including critical care time)  Medications Ordered in UC Medications - No data to display  Initial Impression / Assessment and Plan / UC Course  I have reviewed the triage vital signs and the nursing notes.  Pertinent labs & imaging results that were available during my care of the patient were reviewed by me and considered in my medical decision making (see chart for details).    Plan: The diagnosis will be treated with the following: 1.  Routine screening for STI: A.  STI testing to include HIV and RPR labs have been drawn and results are pending. B.   Appointment will be made for patient to become established with family medicine office within the next several months. 2.  Advised to return to urgent care as needed. Final Clinical Impressions(s) / UC Diagnoses   Final diagnoses:  Routine screening for STI (sexually transmitted infection)     Discharge Instructions      Lab testing will be completed in 48 hours.  If you do not get a call from this office that indicates the test are negative.  Log onto MyChart to be the test results with the post in 48 hours  Advised follow-up PCP return to urgent care as needed.    ED Prescriptions   None    PDMP not reviewed this encounter.   Nyoka Lint, PA-C 08/28/22 1119

## 2022-08-28 NOTE — ED Triage Notes (Signed)
Pt is here for STD check as a preventive  

## 2022-08-29 LAB — HIV ANTIBODY (ROUTINE TESTING W REFLEX): HIV Screen 4th Generation wRfx: NONREACTIVE

## 2022-08-29 LAB — RPR: RPR Ser Ql: NONREACTIVE

## 2022-08-31 LAB — CYTOLOGY, (ORAL, ANAL, URETHRAL) ANCILLARY ONLY
Chlamydia: NEGATIVE
Comment: NEGATIVE
Comment: NEGATIVE
Comment: NORMAL
Neisseria Gonorrhea: NEGATIVE
Trichomonas: POSITIVE — AB

## 2022-09-01 ENCOUNTER — Telehealth (HOSPITAL_COMMUNITY): Payer: Self-pay | Admitting: Emergency Medicine

## 2022-09-01 MED ORDER — METRONIDAZOLE 500 MG PO TABS: 2000.0000 mg | ORAL_TABLET | Freq: Once | ORAL | 0 refills | Status: AC

## 2022-09-15 ENCOUNTER — Ambulatory Visit: Payer: BC Managed Care – PPO | Admitting: Family Medicine

## 2022-09-15 ENCOUNTER — Telehealth: Payer: Self-pay | Admitting: General Practice

## 2022-09-15 NOTE — Telephone Encounter (Signed)
I modified pt's chart to-do not allow to schedule with all provider's.

## 2022-09-15 NOTE — Telephone Encounter (Signed)
I just got a print off pt called after hours service at 7:48am stating he needed to reschedule, he is headed out of town for a family emergency.

## 2022-09-15 NOTE — Telephone Encounter (Signed)
Pt was a no show for a NP app on 09/15/22 with Dr Gena Fray, I did not send a letter.

## 2022-11-24 ENCOUNTER — Emergency Department (HOSPITAL_COMMUNITY)
Admission: EM | Admit: 2022-11-24 | Discharge: 2022-11-24 | Disposition: A | Discharge status: Home / Self Care | Payer: BC Managed Care – PPO | Attending: Emergency Medicine | Admitting: Emergency Medicine

## 2022-11-24 ENCOUNTER — Encounter (HOSPITAL_COMMUNITY): Payer: Self-pay | Admitting: Emergency Medicine

## 2022-11-24 ENCOUNTER — Emergency Department (HOSPITAL_COMMUNITY): Payer: BC Managed Care – PPO

## 2022-11-24 ENCOUNTER — Other Ambulatory Visit: Payer: Self-pay

## 2022-11-24 ENCOUNTER — Telehealth: Payer: BC Managed Care – PPO

## 2022-11-24 DIAGNOSIS — E871 Hypo-osmolality and hyponatremia: Secondary | ICD-10-CM | POA: Diagnosis not present

## 2022-11-24 DIAGNOSIS — E860 Dehydration: Secondary | ICD-10-CM | POA: Diagnosis not present

## 2022-11-24 DIAGNOSIS — J029 Acute pharyngitis, unspecified: Secondary | ICD-10-CM

## 2022-11-24 DIAGNOSIS — J039 Acute tonsillitis, unspecified: Secondary | ICD-10-CM | POA: Insufficient documentation

## 2022-11-24 LAB — BASIC METABOLIC PANEL
Anion gap: 10 (ref 5–15)
BUN: 14 mg/dL (ref 6–20)
CO2: 21 mmol/L — ABNORMAL LOW (ref 22–32)
Calcium: 9.2 mg/dL (ref 8.9–10.3)
Chloride: 103 mmol/L (ref 98–111)
Creatinine, Ser: 1.31 mg/dL — ABNORMAL HIGH (ref 0.61–1.24)
GFR, Estimated: >60 mL/min (ref >60)
Glucose, Bld: 104 mg/dL — ABNORMAL HIGH (ref 70–99)
Potassium: 3.7 mmol/L (ref 3.5–5.1)
Sodium: 134 mmol/L — ABNORMAL LOW (ref 135–145)

## 2022-11-24 LAB — CBC WITH DIFFERENTIAL/PLATELET
Abs Immature Granulocytes: 0.09 10*3/uL — ABNORMAL HIGH (ref 0.00–0.07)
Basophils Absolute: 0 10*3/uL (ref 0.0–0.1)
Basophils Relative: 0 %
Eosinophils Absolute: 0 10*3/uL (ref 0.0–0.5)
Eosinophils Relative: 0 %
HCT: 39 % (ref 39.0–52.0)
Hemoglobin: 13.2 g/dL (ref 13.0–17.0)
Immature Granulocytes: 1 %
Lymphocytes Relative: 4 %
Lymphs Abs: 0.7 10*3/uL (ref 0.7–4.0)
MCH: 31.2 pg (ref 26.0–34.0)
MCHC: 33.8 g/dL (ref 30.0–36.0)
MCV: 92.2 fL (ref 80.0–100.0)
Monocytes Absolute: 1.7 10*3/uL — ABNORMAL HIGH (ref 0.1–1.0)
Monocytes Relative: 10 %
Neutro Abs: 14.8 10*3/uL — ABNORMAL HIGH (ref 1.7–7.7)
Neutrophils Relative %: 85 %
Platelets: 231 10*3/uL (ref 150–400)
RBC: 4.23 MIL/uL (ref 4.22–5.81)
RDW: 12.2 % (ref 11.5–15.5)
WBC: 17.3 10*3/uL — ABNORMAL HIGH (ref 4.0–10.5)
nRBC: 0 % (ref 0.0–0.2)

## 2022-11-24 LAB — GROUP A STREP BY PCR: Group A Strep by PCR: NOT DETECTED

## 2022-11-24 MED ORDER — LACTATED RINGERS IV BOLUS
1000.0000 mL | Freq: Once | INTRAVENOUS | Status: AC
  Administered 2022-11-24: 1000 mL via INTRAVENOUS

## 2022-11-24 MED ORDER — CLINDAMYCIN PALMITATE HCL 75 MG/5ML PO SOLR: 450.0000 mg | Freq: Three times a day (TID) | ORAL | 0 refills | Status: AC

## 2022-11-24 MED ORDER — MORPHINE SULFATE (PF) 4 MG/ML IV SOLN
4.0000 mg | Freq: Once | INTRAVENOUS | Status: AC
  Administered 2022-11-24: 4 mg via INTRAVENOUS
  Filled 2022-11-24: qty 1

## 2022-11-24 MED ORDER — EPINEPHRINE 0.3 MG/0.3ML IJ SOAJ
0.3000 mg | Freq: Once | INTRAMUSCULAR | Status: AC
  Administered 2022-11-24: 0.3 mg via INTRAMUSCULAR

## 2022-11-24 MED ORDER — ONDANSETRON HCL 4 MG/2ML IJ SOLN
4.0000 mg | Freq: Once | INTRAMUSCULAR | Status: AC
  Administered 2022-11-24: 4 mg via INTRAVENOUS
  Filled 2022-11-24: qty 2

## 2022-11-24 MED ORDER — CLINDAMYCIN PHOSPHATE 600 MG/50ML IV SOLN
600.0000 mg | Freq: Once | INTRAVENOUS | Status: AC
  Administered 2022-11-24: 600 mg via INTRAVENOUS
  Filled 2022-11-24: qty 50

## 2022-11-24 MED ORDER — IBUPROFEN 100 MG/5ML PO SUSP: 600.0000 mg | Freq: Four times a day (QID) | ORAL | 1 refills | Status: AC | PRN

## 2022-11-24 MED ORDER — IOHEXOL 350 MG/ML SOLN
75.0000 mL | Freq: Once | INTRAVENOUS | Status: AC | PRN
  Administered 2022-11-24: 75 mL via INTRAVENOUS

## 2022-11-24 MED ORDER — ONDANSETRON 4 MG PO TBDP: 4.0000 mg | ORAL_TABLET | Freq: Three times a day (TID) | ORAL | 0 refills | Status: AC | PRN

## 2022-11-24 MED ORDER — EPINEPHRINE 0.3 MG/0.3ML IJ SOAJ
INTRAMUSCULAR | Status: AC
  Filled 2022-11-24: qty 0.3

## 2022-11-24 MED ORDER — EPINEPHRINE 0.3 MG/0.3ML IJ SOAJ: 0.3000 mg | INTRAMUSCULAR | 0 refills | Status: AC | PRN

## 2022-11-24 MED ORDER — KETOROLAC TROMETHAMINE 15 MG/ML IJ SOLN
15.0000 mg | Freq: Once | INTRAMUSCULAR | Status: AC
  Administered 2022-11-24: 15 mg via INTRAVENOUS
  Filled 2022-11-24: qty 1

## 2022-11-24 MED ORDER — DEXAMETHASONE SODIUM PHOSPHATE 10 MG/ML IJ SOLN
10.0000 mg | Freq: Once | INTRAMUSCULAR | Status: AC
  Administered 2022-11-24: 10 mg via INTRAVENOUS
  Filled 2022-11-24: qty 1

## 2022-11-24 NOTE — ED Notes (Signed)
RN gave pt water, pt was able to swallow but very painful for pt. MD notified.

## 2022-11-24 NOTE — ED Provider Notes (Signed)
[facility] EMERGENCY DEPARTMENT AT [facility] Provider Note   CSN: [id number] Arrival date & time: 11/24/22  0844     History  Chief Complaint  Patient presents with   Sore Throat        Neck Pain   Dysphagia    Kenneth Spence is a 27 y.o. male.  With PMH of schizoaffective psychosis who presents with throat pain, odynophagia, difficulty tolerating secretions for the last 2 to 3 days.  He has been having chills and sweats at home.  He has been having pain and swelling in the back of his throat.  He still has his tonsils.  He has been unable to tolerate food or liquid due to pain with swallowing.  He has had no new foods, liquids, lotions.  No associated rashes, vomiting, chest pain.  He feels like he cannot breathe because his throat is so swollen.  Patient denying any recent oral sex or new sexual partners.   Sore Throat  Neck Pain      Home Medications Prior to Admission medications   Medication Sig Start Date End Date Taking? Authorizing Provider  clindamycin (CLEOCIN) 75 MG/5ML solution Take 30 mLs (450 mg total) by mouth 3 (three) times daily for 10 days. 11/24/22 12/04/22 Yes Mardene Sayer, MD  EPINEPHrine 0.3 mg/0.3 mL IJ SOAJ injection Inject 0.3 mg into the muscle as needed for anaphylaxis. 11/24/22  Yes Mardene Sayer, MD  ibuprofen (ADVIL) 100 MG/5ML suspension Take 30 mLs (600 mg total) by mouth every 6 (six) hours as needed for moderate pain or mild pain. 11/24/22 12/24/22 Yes Mardene Sayer, MD  ondansetron (ZOFRAN-ODT) 4 MG disintegrating tablet Take 1 tablet (4 mg total) by mouth every 8 (eight) hours as needed for nausea or vomiting. 11/24/22  Yes Mardene Sayer, MD  hydrOXYzine (VISTARIL) 25 MG capsule Take 1 capsule (25 mg total) by mouth 2 (two) times daily as needed for anxiety. 12/20/18   Arfeen, Phillips Grout, MD  OLANZapine (ZYPREXA) 10 MG tablet Take 1 tablet (10 mg total) by mouth at bedtime. 11/16/18 01/15/19  Arfeen, Phillips Grout, MD  omega-3  acid ethyl esters (LOVAZA) 1 g capsule Take 1 capsule (1 g total) by mouth 2 (two) times daily. 06/10/18   Malvin Johns, MD      Allergies    Penicillins    Review of Systems   Review of Systems  Musculoskeletal:  Positive for neck pain.    Physical Exam Updated Vital Signs BP 118/66   Pulse 91   Temp 97.9 F (36.6 C) (Oral)   Resp 20   Ht 5\' 11"  (1.803 m)   Wt 83.9 kg   SpO2 100%   BMI 25.80 kg/m  Physical Exam Constitutional: Alert and oriented.  Anxious and uncomfortable but nontoxic Eyes: Conjunctivae are normal. ENT      Head: Normocephalic and atraumatic      Mouth/Throat: Speaking in slightly muffled voice.  Mucous membranes are moist.  Patient unable to tolerate oral exam continues to gag and almost vomit with tongue depressor.  Mallampati score 3.  No trismus noted.  Uvula noted midline unable to visualize tonsils due to patient compliance.  Clear saliva pooled in mouth but no active drooling.      Neck: No stridor.  No tripoding Cardiovascular: S1, S2, tachycardic, regular rhythm Respiratory: Normal respiratory effort. Breath sounds are normal.  No wheezing.  O2 sat 100 on RA Gastrointestinal: Soft and nontender.  Musculoskeletal: Normal range of  motion in all extremities.      Right lower leg: No tenderness or edema.      Left lower leg: No tenderness or edema. Neurologic: Normal speech and language. No gross focal neurologic deficits are appreciated. Skin: Skin is warm, dry and intact. No rash noted. Psychiatric: Mood and affect are normal. Speech and behavior are normal.  ED Results / Procedures / Treatments   Labs (all labs ordered are listed, but only abnormal results are displayed) Labs Reviewed  CBC WITH DIFFERENTIAL/PLATELET - Abnormal; Notable for the following components:      Result Value   WBC 17.3 (*)    Neutro Abs 14.8 (*)    Monocytes Absolute 1.7 (*)    Abs Immature Granulocytes 0.09 (*)    All other components within normal limits  BASIC  METABOLIC PANEL - Abnormal; Notable for the following components:   Sodium 134 (*)    CO2 21 (*)    Glucose, Bld 104 (*)    Creatinine, Ser 1.31 (*)    All other components within normal limits  GROUP A STREP BY PCR  GC/CHLAMYDIA PROBE AMP ([place]) NOT AT [facility]    EKG None  Radiology CT Soft Tissue Neck W Contrast  Result Date: 11/24/2022 CLINICAL DATA:  Assess for epiglottitis/deep space infection. EXAM: CT NECK WITH CONTRAST TECHNIQUE: Multidetector CT imaging of the neck was performed using the standard protocol following the bolus administration of intravenous contrast. RADIATION DOSE REDUCTION: This exam was performed according to the departmental dose-optimization program which includes automated exposure control, adjustment of the mA and/or kV according to patient size and/or use of iterative reconstruction technique. CONTRAST:  75mL OMNIPAQUE IOHEXOL 350 MG/ML SOLN COMPARISON:  None Available. FINDINGS: Pharynx and larynx: Thickening of the tonsils with striations. No retropharyngeal or other abscess. No supraglottic laryngeal edema Salivary glands: No inflammation, mass, or stone. Thyroid: Normal. Lymph nodes: Thickening of cervical lymph nodes especially lateral retropharyngeal. Subtle low-density changes to the right lateral retropharyngeal node but certainly no collection or discrete suppuration. Vascular: Unremarkable Limited intracranial: Negative Visualized orbits: Negative Mastoids and visualized paranasal sinuses: Clear Skeleton: Negative Upper chest: Clear apical lungs IMPRESSION: Tonsillitis and cervical adenitis.  No abscess. Electronically Signed   By: Tiburcio Pea M.D.   On: 11/24/2022 12:21    Procedures Procedures    Medications Ordered in ED Medications  EPINEPHrine (EPI-PEN) injection 0.3 mg (0.3 mg Intramuscular Given 11/24/22 0901)  dexamethasone (DECADRON) injection 10 mg (10 mg Intravenous Given 11/24/22 0948)  lactated ringers bolus 1,000 mL (0 mLs  Intravenous Stopped 11/24/22 1105)  morphine (PF) 4 MG/ML injection 4 mg (4 mg Intravenous Given 11/24/22 0950)  ondansetron (ZOFRAN) injection 4 mg (4 mg Intravenous Given 11/24/22 0947)  clindamycin (CLEOCIN) IVPB 600 mg (0 mg Intravenous Stopped 11/24/22 1137)  iohexol (OMNIPAQUE) 350 MG/ML injection 75 mL (75 mLs Intravenous Contrast Given 11/24/22 1145)  ketorolac (TORADOL) 15 MG/ML injection 15 mg (15 mg Intravenous Given 11/24/22 1238)    ED Course/ Medical Decision Making/ A&P                             Medical Decision Making  Abyan Posthuma is a 27 y.o. male.  With PMH of schizoaffective psychosis who presents with throat pain, odynophagia, difficulty tolerating secretions for the last 2 to 3 days.   Patient is currently protecting airway on exam.  He has no active stridor, tripoding, hypoxia or other signs of  respiratory distress.  His symptoms sound concerning for possible deep space infection and possible PTA, epiglottitis or bad case of tonsillitis.  He has no rash, no hemodynamic instability, no vomiting or other systemic symptoms concerning for anaphylaxis.  No tongue raising or dental issues or submental swelling concerning for Ludwig;s angina. of note, when he presented due to complaints he was given EpiPen by nursing staff.  Patient's labs reviewed by me notable for a leukocytosis 17.3 with left shift.  Also had slightly elevated creatinine from baseline at 1.31.  Mild hyponatremia 134.  Glucose 104.  Strep negative.  CT neck with contrast obtained which I personally reviewed.  No deep space infection, no abscess noted.  Bilateral tonsillitis and cervical adenitis.  Patient was feeling better after IV fluids, IV Decadron, IV clindamycin due to penicillin anaphylactic allergy and Toradol.  Tachycardia resolved.  No hemodynamic instability.  No hypoxia satting 100% on room air.  He was able to tolerate fluids.  I offered admission for continued IV fluids and antibiotics however he is  declining and would like to go home and follow-up outpatient.  I have prescribed him a course of clindamycin liquid solution, as needed ibuprofen solution, Zofran and ENT information for follow-up.  Discussed strict return precautions.  Provided EpiPen prescription if signs of anaphylaxis. patient in agreement with plans..    Amount and/or Complexity of Data Reviewed Labs: ordered. Radiology: ordered.  Risk Prescription drug management.     Final Clinical Impression(s) / ED Diagnoses Final diagnoses:  Tonsillitis  Sore throat  Dehydration    Rx / DC Orders ED Discharge Orders          Ordered    ondansetron (ZOFRAN-ODT) 4 MG disintegrating tablet  Every 8 hours PRN        11/24/22 1309    clindamycin (CLEOCIN) 75 MG/5ML solution  3 times daily        11/24/22 1309    ibuprofen (ADVIL) 100 MG/5ML suspension  Every 6 hours PRN        11/24/22 1309    EPINEPHrine 0.3 mg/0.3 mL IJ SOAJ injection  As needed        11/24/22 1310              Mardene Sayer, MD 11/24/22 1314

## 2022-11-24 NOTE — ED Triage Notes (Addendum)
Pt reports two days of left sided neck pain and sore throat. Now with pain along both sides of his neck. Pt reports difficulty swallowing saliva, noted to have voice changes, diaphoretic. Per pt tongue swelling and painful as well. Not handling secretions. Unable to eat or drink. Denies recent fever.

## 2022-11-24 NOTE — Discharge Instructions (Addendum)
You have tonsillitis or an infection of the tonsils.  Continue taking the clindamycin, the antibiotics, as prescribed and do not miss any doses.  You are given first dose of IV antibiotics in the ER as well as IV fluids and steroids.  Continue taking ibuprofen as prescribed for pain and inflammation.  You can take the Zofran as needed for nausea or vomiting.  Make sure to drink plenty of fluids.  Make an appointment with the ear nose and throat doctors listed in your discharge paperwork for follow-up in the outpatient setting.  Come back to the ER or call 911 if you have significantly worsening sore throat, difficulty breathing, confusion, unable to keep the antibiotics down, or any other symptoms concerning to you.  You have been prescribed an epi-pen to administer if you develop any signs of life-threatening allergic reaction or anaphylaxis.  Signs of this would be rash, wheezing, difficulty breathing.

## 2022-11-25 LAB — GC/CHLAMYDIA PROBE AMP (~~LOC~~) NOT AT ARMC
Chlamydia: NEGATIVE
Comment: NEGATIVE
Comment: NORMAL
Neisseria Gonorrhea: POSITIVE — AB

## 2022-11-27 ENCOUNTER — Ambulatory Visit
Admission: RE | Admit: 2022-11-27 | Discharge: 2022-11-27 | Disposition: A | Discharge status: Home / Self Care | Payer: BC Managed Care – PPO | Source: Ambulatory Visit | Attending: Family Medicine | Admitting: Family Medicine

## 2022-11-27 DIAGNOSIS — A64 Unspecified sexually transmitted disease: Secondary | ICD-10-CM

## 2022-11-27 MED ORDER — AZITHROMYCIN 500 MG PO TABS
2000.0000 mg | ORAL_TABLET | Freq: Once | ORAL | Status: AC
  Administered 2022-11-27: 2000 mg via ORAL

## 2022-11-27 MED ORDER — GENTAMICIN SULFATE 40 MG/ML IJ SOLN
240.0000 mg | Freq: Once | INTRAMUSCULAR | Status: AC
  Administered 2022-11-27: 240 mg via INTRAMUSCULAR

## 2022-11-27 NOTE — ED Provider Notes (Signed)
Pt here for GC tx. 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 Swab was done of OP, and resulted positive for gonorrhea.   Gent 240 mg IM plus azithromycin 500 mg, 4 tabs po given here.   Zenia Resides, MD 11/27/22 [id number]

## 2022-11-27 NOTE — ED Triage Notes (Signed)
Pt reports to UC for treatment of Gonorrhea. Pt was tested x 3 days ago at the ER and tested positive.    Pt reports tender throat pain still persist.

## 2024-02-13 ENCOUNTER — Ambulatory Visit
Admission: EM | Admit: 2024-02-13 | Discharge: 2024-02-13 | Disposition: A | Discharge status: Home / Self Care | Attending: Emergency Medicine | Admitting: Emergency Medicine

## 2024-02-13 ENCOUNTER — Encounter: Payer: Self-pay | Admitting: Emergency Medicine

## 2024-02-13 DIAGNOSIS — S61216A Laceration without foreign body of right little finger without damage to nail, initial encounter: Secondary | ICD-10-CM | POA: Diagnosis not present

## 2024-02-13 MED ORDER — LIDOCAINE HCL (PF) 1 % IJ SOLN: 5.0000 mL | Freq: Once | INTRAMUSCULAR | Status: DC

## 2024-02-13 NOTE — ED Provider Notes (Signed)
 [facility]    CSN: [id number] Arrival date & time: 02/13/24  1353      History   Chief Complaint Chief Complaint  Patient presents with   Laceration   Wound Check    HPI Kenneth Spence is a 28 y.o. male.  Patient with past history significant for schizoaffective psychosis presents to the urgent care today with concerns of a laceration.  Patient reportedly sustained a laceration of the right pinky after trying to get a grill got caught on something.  He reports that he was can let this heal return but was advised to come in for evaluation by an EMT.  He is not on blood thinners no active bleeding at this time.  Denies any difficulty with movement.  Reports minimal pain at this time.   Laceration Wound Check    Past Medical History:  Diagnosis Date   Cardiac arrest [facility]) 03/1997   Wed patient had chest pain while running in PE, to ED, dehydrated    Patient Active Problem List   Diagnosis Date Noted   Schizoaffective psychosis (HCC) 06/08/2018    Past Surgical History:  Procedure Laterality Date   ORCHIOPEXY Bilateral 08/28/2012   Procedure: TESTICULAR TORSION REPAIR;  Surgeon: CHRISTELLA. Julietta Millman, MD;  Location: [facility] OR;  Service: Pediatrics;  Laterality: Bilateral;       Home Medications    Prior to Admission medications   Medication Sig Start Date End Date Taking? Authorizing Provider  EPINEPHrine  0.3 mg/0.3 mL IJ SOAJ injection Inject 0.3 mg into the muscle as needed for anaphylaxis. 11/24/22   Ethyl Richerd BROCKS, MD  hydrOXYzine  (VISTARIL ) 25 MG capsule Take 1 capsule (25 mg total) by mouth 2 (two) times daily as needed for anxiety. 12/20/18   Arfeen, Leni DASEN, MD  OLANZapine  (ZYPREXA ) 10 MG tablet Take 1 tablet (10 mg total) by mouth at bedtime. 11/16/18 01/15/19  Arfeen, Leni DASEN, MD  omega-3 acid ethyl esters (LOVAZA ) 1 g capsule Take 1 capsule (1 g total) by mouth 2 (two) times daily. 06/10/18   Malinda Rogue, MD  ondansetron  (ZOFRAN -ODT) 4 MG  disintegrating tablet Take 1 tablet (4 mg total) by mouth every 8 (eight) hours as needed for nausea or vomiting. 11/24/22   Ethyl Richerd BROCKS, MD    Family History Family History  Problem Relation Age of Onset   Bipolar disorder Maternal Grandmother     Social History Social History   Tobacco Use   Smoking status: Every Day    Types: Cigarettes    Passive exposure: Never   Smokeless tobacco: Never  Vaping Use   Vaping status: Never Used  Substance Use Topics   Alcohol use: Not Currently   Drug use: Yes    Types: Marijuana     Allergies   Penicillins   Review of Systems Review of Systems  Skin:  Positive for wound.  All other systems reviewed and are negative.    Physical Exam Triage Vital Signs ED Triage Vitals  Encounter Vitals Group     BP 02/13/24 1419 113/72     Girls Systolic BP Percentile --      Girls Diastolic BP Percentile --      Boys Systolic BP Percentile --      Boys Diastolic BP Percentile --      Pulse Rate 02/13/24 1419 (!) 57     Resp 02/13/24 1419 16     Temp 02/13/24 1419 98.7 F (37.1 C)     Temp Source 02/13/24  1419 Oral     SpO2 02/13/24 1419 97 %     Weight 02/13/24 1418 184 lb 15.5 oz (83.9 kg)     Height --      Head Circumference --      Peak Flow --      Pain Score 02/13/24 1417 7     Pain Loc --      Pain Education --      Exclude from Growth Chart --    No data found.  Updated Vital Signs BP 113/72 (BP Location: Left Arm)   Pulse (!) 57   Temp 98.7 F (37.1 C) (Oral)   Resp 16   Wt 184 lb 15.5 oz (83.9 kg)   SpO2 97%   BMI 25.80 kg/m   Visual Acuity Right Eye Distance:   Left Eye Distance:   Bilateral Distance:    Right Eye Near:   Left Eye Near:    Bilateral Near:     Physical Exam Vitals and nursing note reviewed.  Constitutional:      General: He is not in acute distress.    Appearance: He is well-developed.  HENT:     Head: Normocephalic and atraumatic.  Eyes:     Conjunctiva/sclera:  Conjunctivae normal.  Pulmonary:     Effort: No respiratory distress.  Musculoskeletal:        General: No swelling.  Skin:    General: Skin is warm and dry.     Capillary Refill: Capillary refill takes less than 2 seconds.     Findings: Lesion present.         Comments: 3cm lesion to the palmar aspect of the right little finger.   Neurological:     Mental Status: He is alert.  Psychiatric:        Mood and Affect: Mood normal.      UC Treatments / Results  Labs (all labs ordered are listed, but only abnormal results are displayed) Labs Reviewed - No data to display  EKG   Radiology No results found.  Procedures Procedures (including critical care time)  Medications Ordered in UC Medications  lidocaine  (PF) (XYLOCAINE ) 1 % injection 5 mL (has no administration in time range)    Initial Impression / Assessment and Plan / UC Course  I have reviewed the triage vital signs and the nursing notes.  Pertinent labs & imaging results that were available during my care of the patient were reviewed by me and considered in my medical decision making (see chart for details).     This patient presents to the UC for concern of laceration.  Differential diagnosis includes superficial laceration, tendon injury, vascular injury, finger dislocation, finger fracture   Medicines ordered and prescription drug management:  I ordered medication including lidocaine  for anesthetic Reevaluation of the patient after these medicines showed that the patient improved I have reviewed the patients home medicines and have made adjustments as needed   Problem List /UC course:  Patient presented to urgent care today for concerns of laceration.  Patient reportedly was grilling when he reached under his grill for something and was caught on a sharp object and sustained laceration to the right little finger.  Reports no difficulty with movement or altered sensation.  Denies minimal pain.  Not on  blood thinners. A 3 cm laceration was noted to the palmar aspect of the right little finger.  No impairment of movement.  Minimal bleeding at this time.  Neurovascularly intact neuromuscularly intact.  Patient  consented to laceration repair. Laceration repair performed without any complications.  5 sutures placed in the right little finger.  Advised patient to return in 7 to 10 days for wound evaluation/suture removal.  No indication for antibiotic administration at this time given lack of grossly contaminated wound.  Otherwise stable at this time for outpatient follow-up and discharge home.   Social Determinants of Health:  None  Final Clinical Impressions(s) / UC Diagnoses   Final diagnoses:  Laceration of right little finger without foreign body without damage to nail, initial encounter     Discharge Instructions      You were seen today for concerns of a laceration to the right pinky. This was repaired today and should be reevaluated in about 7 to 10 days for poss removal of the stitches.  Keep the area clean and dry as best as possible.  For any concerns of infection, please seek medical evaluation.     ED Prescriptions   None    PDMP not reviewed this encounter.   Lolitha Tortora A, PA-C 02/13/24 1525

## 2024-02-13 NOTE — Discharge Instructions (Signed)
 You were seen today for concerns of a laceration to the right pinky. This was repaired today and should be reevaluated in about 7 to 10 days for poss removal of the stitches.  Keep the area clean and dry as best as possible.  For any concerns of infection, please seek medical evaluation.

## 2024-02-13 NOTE — ED Triage Notes (Addendum)
 Pt presents c/o laceration on right hand pinky finger. Pt says he was trying to get the grill going and somehow sliced his finger open in the process. Pt says EMT advised him to come to the Urgent Care. Pt states he works in the [employer] field so his vaccines are up to date.

## 2024-02-14 DIAGNOSIS — S61216A Laceration without foreign body of right little finger without damage to nail, initial encounter: Secondary | ICD-10-CM | POA: Diagnosis not present

## 2024-02-22 ENCOUNTER — Encounter (HOSPITAL_COMMUNITY): Payer: Self-pay

## 2024-02-22 ENCOUNTER — Emergency Department (HOSPITAL_COMMUNITY)
Admission: EM | Admit: 2024-02-22 | Discharge: 2024-02-22 | Disposition: A | Discharge status: Home / Self Care | Attending: Emergency Medicine | Admitting: Emergency Medicine

## 2024-02-22 ENCOUNTER — Other Ambulatory Visit: Payer: Self-pay

## 2024-02-22 DIAGNOSIS — Z4802 Encounter for removal of sutures: Secondary | ICD-10-CM | POA: Insufficient documentation

## 2024-02-22 NOTE — ED Triage Notes (Signed)
 Pt presents to ED with wrap on his right pinky.  States he recently injured it and has stitches but he is now having numbness in that finger that is new.

## 2024-02-22 NOTE — ED Provider Notes (Signed)
 [place] EMERGENCY DEPARTMENT AT [facility] Provider Note   CSN: [id number] Arrival date & time: 02/22/24  1214     Patient presents with: finger numbness   Kenneth Spence is a 28 y.o. male concerns over numbness on the volar lateral aspect of the fifth digit of his right hand.  He had sutures placed on 24 August, had isolated numbness over the aspect where the sutures had been placed.  Sutures due for removal at this time, have been in place for 9 days.  No complaints of purulent exudate or of any abnormal drainage, does have increased discomfort secondary to sutures and restricted movement.   HPI     Prior to Admission medications   Medication Sig Start Date End Date Taking? Authorizing Provider  EPINEPHrine  0.3 mg/0.3 mL IJ SOAJ injection Inject 0.3 mg into the muscle as needed for anaphylaxis. 11/24/22   Ethyl Richerd BROCKS, MD  hydrOXYzine  (VISTARIL ) 25 MG capsule Take 1 capsule (25 mg total) by mouth 2 (two) times daily as needed for anxiety. 12/20/18   Arfeen, Leni DASEN, MD  OLANZapine  (ZYPREXA ) 10 MG tablet Take 1 tablet (10 mg total) by mouth at bedtime. 11/16/18 01/15/19  Arfeen, Leni DASEN, MD  omega-3 acid ethyl esters (LOVAZA ) 1 g capsule Take 1 capsule (1 g total) by mouth 2 (two) times daily. 06/10/18   Malinda Rogue, MD  ondansetron  (ZOFRAN -ODT) 4 MG disintegrating tablet Take 1 tablet (4 mg total) by mouth every 8 (eight) hours as needed for nausea or vomiting. 11/24/22   Ethyl Richerd BROCKS, MD    Allergies: Penicillins    Review of Systems  Skin:  Positive for wound.  All other systems reviewed and are negative.   Updated Vital Signs BP (!) 141/69 (BP Location: Right Arm)   Pulse 88   Temp 98.3 F (36.8 C)   Resp 18   Ht 5' 11 (1.803 m)   Wt 83.9 kg   SpO2 100%   BMI 25.80 kg/m   Physical Exam Vitals and nursing note reviewed.  Constitutional:      General: He is not in acute distress.    Appearance: He is well-developed.  HENT:     Head:  Normocephalic and atraumatic.  Eyes:     Conjunctiva/sclera: Conjunctivae normal.  Cardiovascular:     Rate and Rhythm: Normal rate and regular rhythm.     Heart sounds: No murmur heard. Pulmonary:     Effort: Pulmonary effort is normal. No respiratory distress.     Breath sounds: Normal breath sounds.  Abdominal:     Palpations: Abdomen is soft.     Tenderness: There is no abdominal tenderness.  Musculoskeletal:        General: No swelling.     Cervical back: Neck supple.  Skin:    General: Skin is warm and dry.     Capillary Refill: Capillary refill takes less than 2 seconds.     Findings: Wound present.     Comments: Well-healing wound on the lateral aspect of the volar fifth digit of the right hand.  Laceration appears to be approximately 3 cm in length with 5 simple interrupted sutures placed for closure.  Neurological:     Mental Status: He is alert.  Psychiatric:        Mood and Affect: Mood normal.     (all labs ordered are listed, but only abnormal results are displayed) Labs Reviewed - No data to display  EKG: None  Radiology: No results  found.   Suture Removal  Date/Time: 02/22/2024 12:35 PM  Performed by: Myriam Dorn BROCKS, PA Authorized by: Myriam Dorn BROCKS, PA   Consent:    Consent obtained:  Verbal   Consent given by:  Patient   Risks, benefits, and alternatives were discussed: yes     Risks discussed:  Bleeding, pain and wound separation   Alternatives discussed:  No treatment, delayed treatment, alternative treatment, observation and referral Universal protocol:    Procedure explained and questions answered to patient or proxy's satisfaction: yes     Patient identity confirmed:  Verbally with patient, arm band and hospital-assigned identification number Location:    Location:  Upper extremity   Upper extremity location:  Hand   Hand location:  R small finger Procedure details:    Wound appearance:  No signs of infection, good wound healing  and clean   Number of sutures removed:  5 Post-procedure details:    Post-removal:  No dressing applied   Procedure completion:  Tolerated well, no immediate complications    Medications Ordered in the ED - No data to display                                  Medical Decision Making  Numbness over fifth digit likely due to inflammation secondary to wound healing.  This is isolated specifically and that the lateral aspect of the volar fifth digit of the right hand, no further numbness in the distribution of C8, no motor weakness appreciated.  As such, sutures removed as noted in the procedure note, careful return precautions given, encouraged follow-up with primary care.     Final diagnoses:  Encounter for removal of sutures    ED Discharge Orders     None          Myriam Dorn BROCKS, [place] 02/22/24 1236    Jerrol Agent, MD 02/22/24 1310
